# Patient Record
Sex: Male | Born: 1950 | Race: White | Hispanic: No | Marital: Married | Smoking: Former smoker
Health system: Southern US, Community
[De-identification: ages and names within clinical notes are randomized; demographics above are authoritative.]

## PROBLEM LIST (undated history)

## (undated) DIAGNOSIS — J3089 Other allergic rhinitis: Secondary | ICD-10-CM

## (undated) DIAGNOSIS — F419 Anxiety disorder, unspecified: Secondary | ICD-10-CM

## (undated) DIAGNOSIS — F17200 Nicotine dependence, unspecified, uncomplicated: Secondary | ICD-10-CM

## (undated) DIAGNOSIS — M5136 Other intervertebral disc degeneration, lumbar region: Secondary | ICD-10-CM

## (undated) DIAGNOSIS — H01006 Unspecified blepharitis left eye, unspecified eyelid: Secondary | ICD-10-CM

## (undated) DIAGNOSIS — Z8547 Personal history of malignant neoplasm of testis: Secondary | ICD-10-CM

## (undated) DIAGNOSIS — K219 Gastro-esophageal reflux disease without esophagitis: Secondary | ICD-10-CM

## (undated) DIAGNOSIS — N529 Male erectile dysfunction, unspecified: Secondary | ICD-10-CM

## (undated) DIAGNOSIS — F1020 Alcohol dependence, uncomplicated: Secondary | ICD-10-CM

## (undated) DIAGNOSIS — R609 Edema, unspecified: Secondary | ICD-10-CM

## (undated) DIAGNOSIS — IMO0001 Reserved for inherently not codable concepts without codable children: Secondary | ICD-10-CM

## (undated) DIAGNOSIS — I8390 Asymptomatic varicose veins of unspecified lower extremity: Secondary | ICD-10-CM

## (undated) DIAGNOSIS — G8929 Other chronic pain: Secondary | ICD-10-CM

## (undated) DIAGNOSIS — K7031 Alcoholic cirrhosis of liver with ascites: Principal | ICD-10-CM

## (undated) DIAGNOSIS — M542 Cervicalgia: Principal | ICD-10-CM

## (undated) DIAGNOSIS — H9190 Unspecified hearing loss, unspecified ear: Secondary | ICD-10-CM

## (undated) DIAGNOSIS — E786 Lipoprotein deficiency: Secondary | ICD-10-CM

## (undated) DIAGNOSIS — I10 Essential (primary) hypertension: Secondary | ICD-10-CM

## (undated) DIAGNOSIS — M545 Low back pain, unspecified: Secondary | ICD-10-CM

## (undated) DIAGNOSIS — K6389 Other specified diseases of intestine: Secondary | ICD-10-CM

## (undated) DIAGNOSIS — C801 Malignant (primary) neoplasm, unspecified: Secondary | ICD-10-CM

## (undated) DIAGNOSIS — R600 Localized edema: Secondary | ICD-10-CM

## (undated) DIAGNOSIS — M51369 Other intervertebral disc degeneration, lumbar region without mention of lumbar back pain or lower extremity pain: Secondary | ICD-10-CM

## (undated) DIAGNOSIS — H01003 Unspecified blepharitis right eye, unspecified eyelid: Secondary | ICD-10-CM

## (undated) HISTORY — DX: Localized edema: R60.0

## (undated) HISTORY — PX: APPENDECTOMY: SHX54

## (undated) HISTORY — DX: Unspecified hearing loss, unspecified ear: H91.90

## (undated) HISTORY — DX: Cervicalgia: M54.2

## (undated) HISTORY — DX: Low back pain, unspecified: M54.50

## (undated) HISTORY — PX: LYMPH NODE DISSECTION: SHX5087

## (undated) HISTORY — PX: OTHER SURGICAL HISTORY: SHX169

## (undated) HISTORY — DX: Other specified diseases of intestine: K63.89

## (undated) HISTORY — DX: Other intervertebral disc degeneration, lumbar region without mention of lumbar back pain or lower extremity pain: M51.369

## (undated) HISTORY — DX: Asymptomatic varicose veins of unspecified lower extremity: I83.90

## (undated) HISTORY — DX: Essential (primary) hypertension: I10

## (undated) HISTORY — DX: Male erectile dysfunction, unspecified: N52.9

## (undated) HISTORY — DX: Unspecified blepharitis left eye, unspecified eyelid: H01.006

## (undated) HISTORY — DX: Gastro-esophageal reflux disease without esophagitis: K21.9

## (undated) HISTORY — PX: TONSILLECTOMY: SUR1361

## (undated) HISTORY — DX: Nicotine dependence, unspecified, uncomplicated: F17.200

## (undated) HISTORY — DX: Other allergic rhinitis: J30.89

## (undated) HISTORY — DX: Edema, unspecified: R60.9

## (undated) HISTORY — DX: Alcohol dependence, uncomplicated: F10.20

## (undated) HISTORY — DX: Low back pain: M54.5

## (undated) HISTORY — DX: Other chronic pain: G89.29

## (undated) HISTORY — DX: Reserved for inherently not codable concepts without codable children: IMO0001

## (undated) HISTORY — DX: Alcoholic cirrhosis of liver with ascites: K70.31

## (undated) HISTORY — DX: Unspecified blepharitis right eye, unspecified eyelid: H01.003

## (undated) HISTORY — DX: Personal history of malignant neoplasm of testis: Z85.47

## (undated) HISTORY — DX: Lipoprotein deficiency: E78.6

## (undated) HISTORY — DX: Other intervertebral disc degeneration, lumbar region: M51.36

---

## 1977-07-03 DIAGNOSIS — Z8547 Personal history of malignant neoplasm of testis: Secondary | ICD-10-CM

## 1977-07-03 HISTORY — DX: Personal history of malignant neoplasm of testis: Z85.47

## 2005-06-05 ENCOUNTER — Ambulatory Visit: Payer: Self-pay | Admitting: Family Medicine

## 2005-06-06 ENCOUNTER — Encounter: Admission: RE | Admit: 2005-06-06 | Discharge: 2005-06-06 | Payer: Self-pay | Admitting: Family Medicine

## 2005-06-08 ENCOUNTER — Ambulatory Visit: Payer: Self-pay | Admitting: Family Medicine

## 2005-06-12 ENCOUNTER — Encounter: Admission: RE | Admit: 2005-06-12 | Discharge: 2005-06-12 | Payer: Self-pay | Admitting: Family Medicine

## 2005-06-14 ENCOUNTER — Ambulatory Visit: Payer: Self-pay | Admitting: Family Medicine

## 2005-07-27 ENCOUNTER — Ambulatory Visit: Payer: Self-pay | Admitting: Family Medicine

## 2006-01-10 ENCOUNTER — Inpatient Hospital Stay (HOSPITAL_COMMUNITY): Admission: EM | Admit: 2006-01-10 | Discharge: 2006-01-12 | Payer: Self-pay | Admitting: Emergency Medicine

## 2006-01-10 ENCOUNTER — Ambulatory Visit: Payer: Self-pay | Admitting: Internal Medicine

## 2006-01-15 ENCOUNTER — Ambulatory Visit: Payer: Self-pay | Admitting: Family Medicine

## 2006-02-01 ENCOUNTER — Ambulatory Visit: Payer: Self-pay | Admitting: Family Medicine

## 2006-03-19 ENCOUNTER — Ambulatory Visit: Payer: Self-pay | Admitting: Family Medicine

## 2006-04-04 ENCOUNTER — Ambulatory Visit: Payer: Self-pay | Admitting: Family Medicine

## 2006-05-28 ENCOUNTER — Ambulatory Visit: Payer: Self-pay | Admitting: Family Medicine

## 2006-05-28 LAB — CONVERTED CEMR LAB
Albumin: 4.3 g/dL (ref 3.5–5.2)
Alkaline Phosphatase: 53 units/L (ref 39–117)
Basophils Absolute: 0 10*3/uL (ref 0.0–0.1)
CO2: 30 meq/L (ref 19–32)
Chol/HDL Ratio, serum: 6.7
Creatinine, Ser: 1 mg/dL (ref 0.4–1.5)
GFR calc non Af Amer: 83 mL/min
Glomerular Filtration Rate, Af Am: 100 mL/min/{1.73_m2}
Hgb A1c MFr Bld: 5.6 % (ref 4.6–6.0)
MCHC: 33.3 g/dL (ref 30.0–36.0)
Monocytes Relative: 6.7 % (ref 3.0–11.0)
Neutro Abs: 4.5 10*3/uL (ref 1.4–7.7)
Platelets: 176 10*3/uL (ref 150–400)
RBC: 4.87 M/uL (ref 4.22–5.81)
RDW: 12.8 % (ref 11.5–14.6)
Sodium: 143 meq/L (ref 135–145)
Total Bilirubin: 1.1 mg/dL (ref 0.3–1.2)
Triglyceride fasting, serum: 55 mg/dL (ref 0–149)
VLDL: 11 mg/dL (ref 0–40)

## 2006-06-04 ENCOUNTER — Ambulatory Visit: Payer: Self-pay | Admitting: Family Medicine

## 2006-08-01 ENCOUNTER — Ambulatory Visit: Payer: Self-pay | Admitting: Family Medicine

## 2006-08-29 ENCOUNTER — Ambulatory Visit: Payer: Self-pay | Admitting: Family Medicine

## 2006-08-31 ENCOUNTER — Ambulatory Visit: Payer: Self-pay | Admitting: Internal Medicine

## 2007-02-22 IMAGING — US US SCROTUM
1 series · 13 of 25 positions shown · non-contrast
Comparison: none

CLINICAL DATA: Palpable lumps in the left testicular region on physical exam. 
 SCROTAL ULTRASOUND:
TECHNIQUE: Complete ultrasound examination of the testicles, epididymis, and other scrotal structures was performed.

[Series 1: unknown · 0.09mm/px · 13 of 48 slices shown]
[im 1/48]
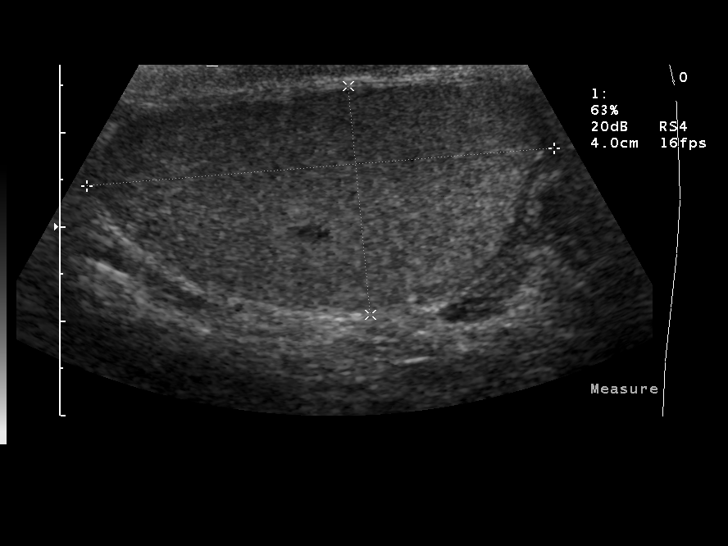
[im 4/48]
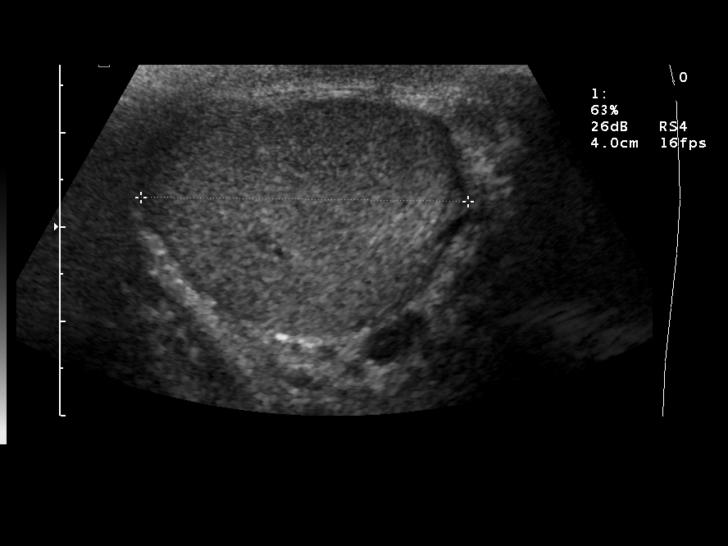
[im 8/48]
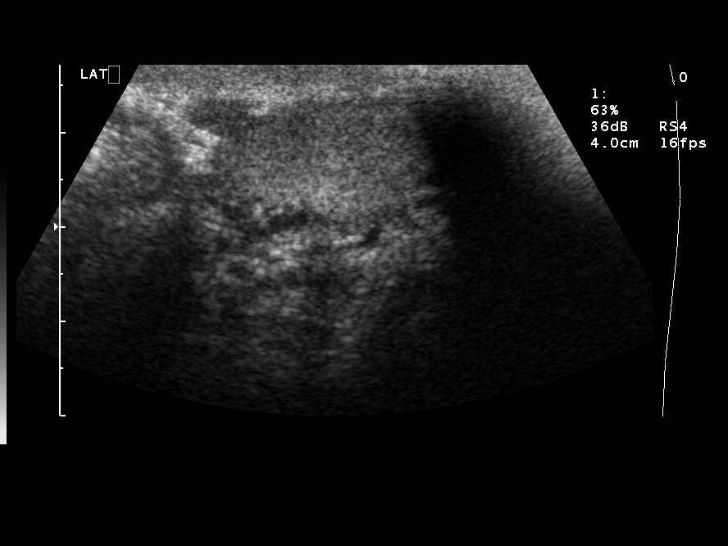
[im 12/48]
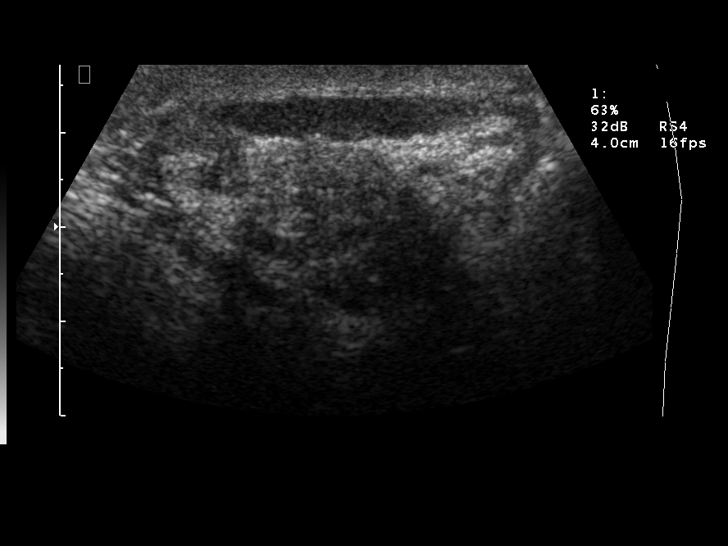
[im 16/48]
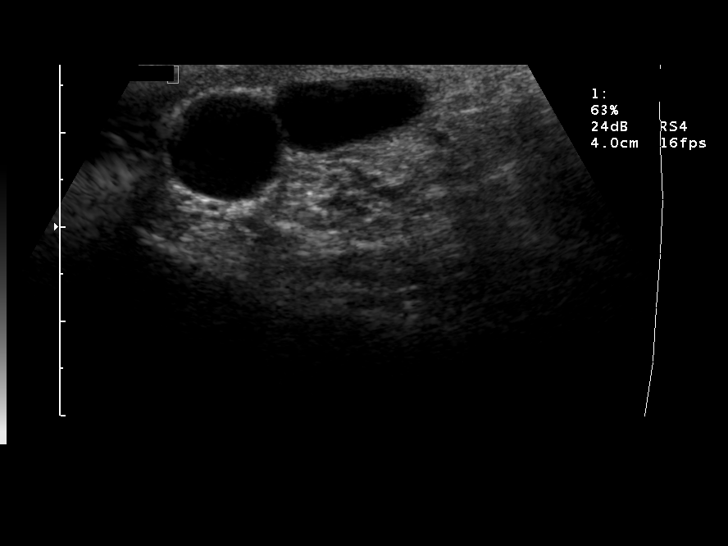
[im 20/48]
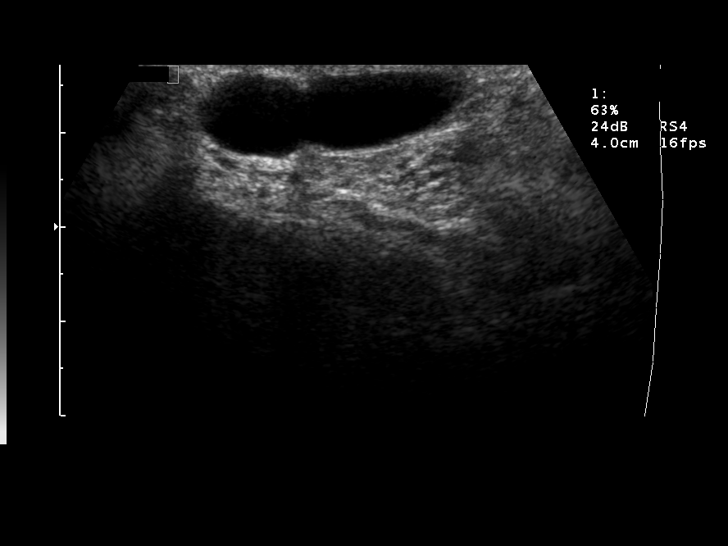
[im 24/48]
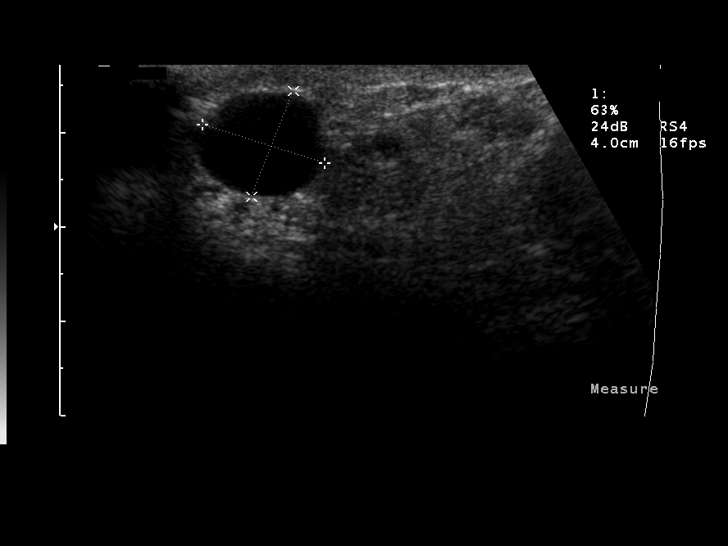
[im 28/48]
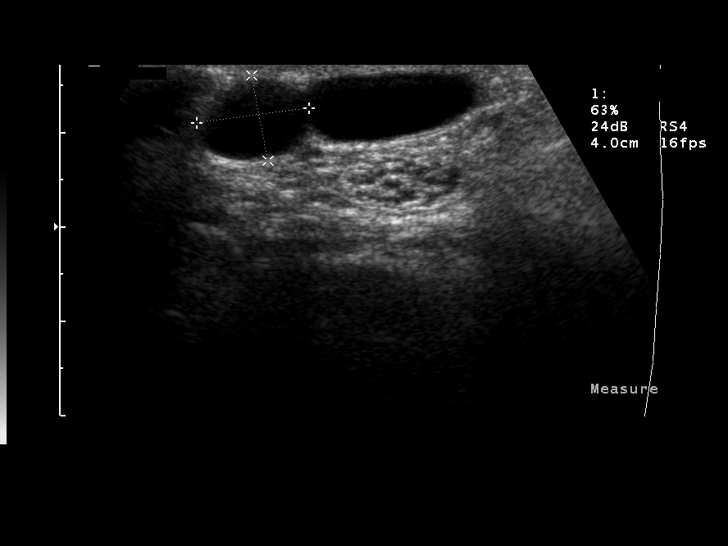
[im 32/48]
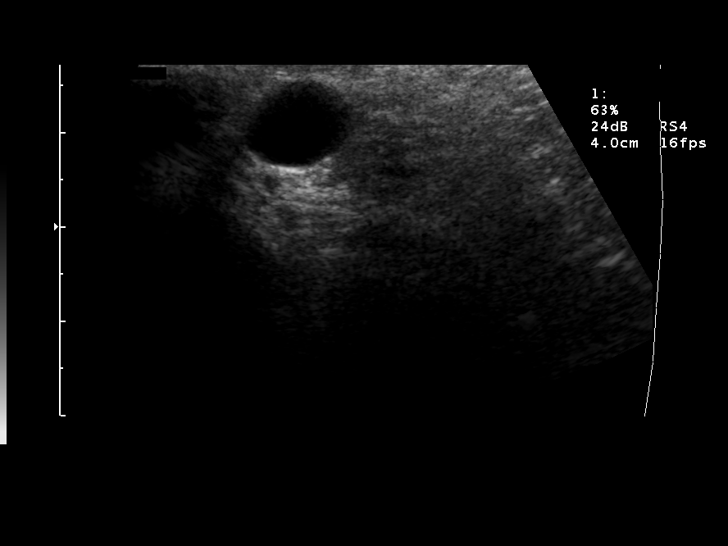
[im 36/48]
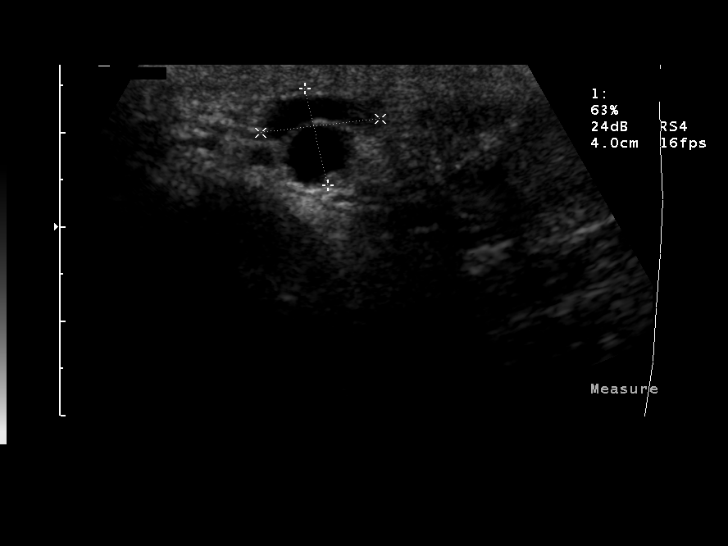
[im 40/48]
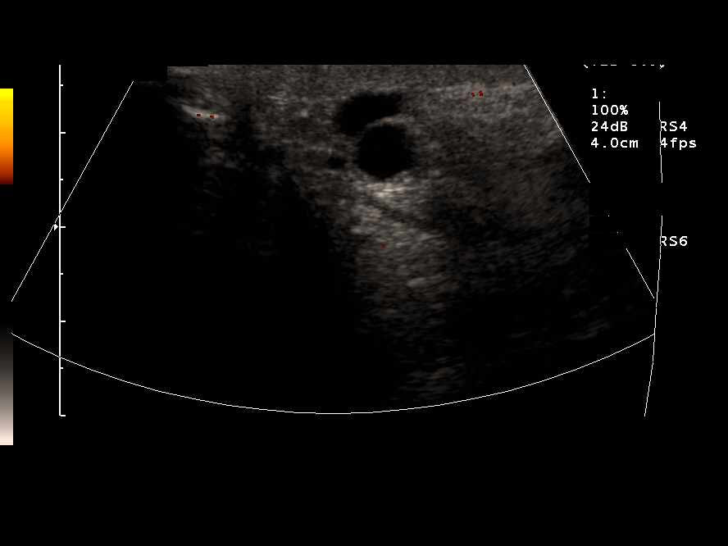
[im 44/48]
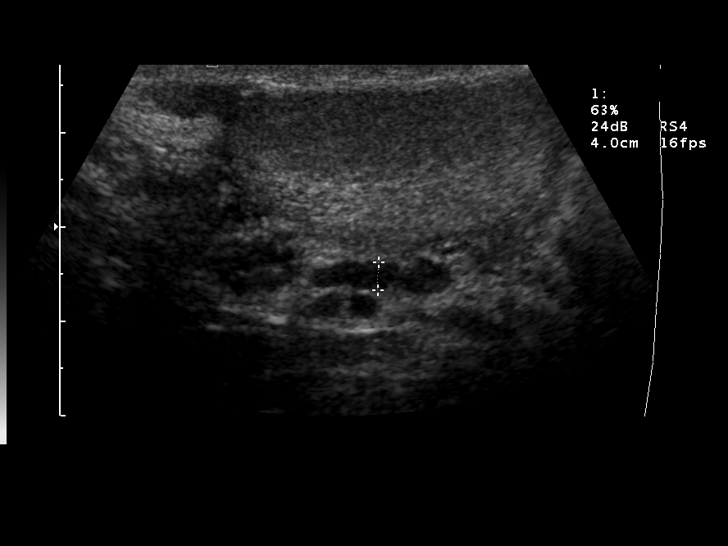
[im 48/48]
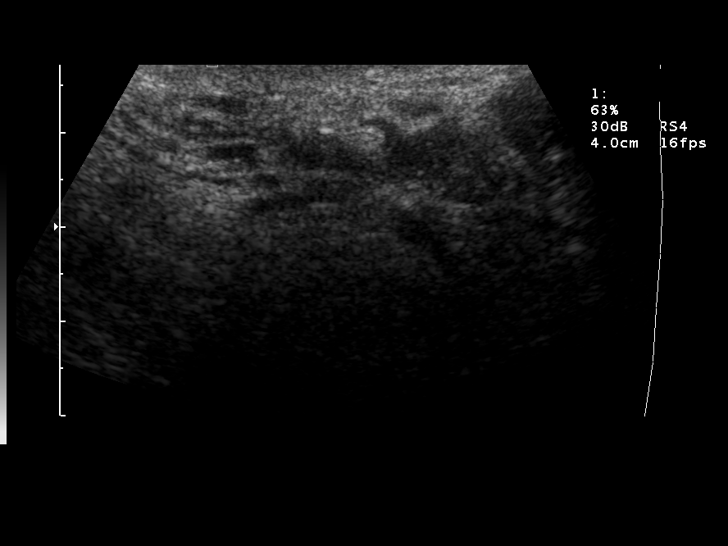

[13 of 25 positions shown; findings below may reference images not displayed]

FINDINGS: Patient has a remote history of right testicular cancer and right orchectomy.  
 Left testicle measures 5.0 x 2.4 x 3.5 cm.  There is expected color Doppler flow in the left testicle.  Arterial and Doppler wave form evaluation was neither requested nor performed.  
 There are multiple cysts extending from or immediately adjacent to the head of the left epididymis.  One of these measures 1.9 x 0.8 x 1.7 cm.  A second measures 1.2 x 0.9 x 1.2 cm; the third measures 1.1 x 1.3 x 0.7 cm; a fourth measures 1.4 x 1.2 x 1.3 cm.  No definite gut signature is identified to suggest herniated bowel, and these do not demonstrate internal vascular flow.  They are most consistent with spermatoceles or epididymal cysts.  
 There is some faint hypoechogenicity focally in the left testicle measuring 3 x 2 mm.  This is a relatively small focus and is felt to be highly unlikely to represent neoplastic disease.
IMPRESSION: 1.  Four cystic lesions are associated with the region of the epididymis on the left and likely reflect spermatoceles or epididymal cysts. 
 2.  No significant intratesticular mass is identified.

## 2007-04-29 DIAGNOSIS — I10 Essential (primary) hypertension: Secondary | ICD-10-CM | POA: Insufficient documentation

## 2007-07-19 ENCOUNTER — Ambulatory Visit: Payer: Self-pay | Admitting: Family Medicine

## 2007-07-19 LAB — CONVERTED CEMR LAB
ALT: 62 units/L — ABNORMAL HIGH (ref 0–53)
AST: 96 units/L — ABNORMAL HIGH (ref 0–37)
Basophils Relative: 1 % (ref 0.0–1.0)
Bilirubin, Direct: 0.2 mg/dL (ref 0.0–0.3)
CO2: 30 meq/L (ref 19–32)
Calcium: 9.6 mg/dL (ref 8.4–10.5)
Chloride: 103 meq/L (ref 96–112)
Eosinophils Relative: 2 % (ref 0.0–5.0)
GFR calc Af Amer: 129 mL/min
Glucose, Bld: 95 mg/dL (ref 70–99)
Hemoglobin: 15.1 g/dL (ref 13.0–17.0)
Lymphocytes Relative: 33.6 % (ref 12.0–46.0)
Neutro Abs: 2.2 10*3/uL (ref 1.4–7.7)
Platelets: 204 10*3/uL (ref 150–400)
Total Bilirubin: 1 mg/dL (ref 0.3–1.2)
Total Protein: 6.9 g/dL (ref 6.0–8.3)
Triglycerides: 78 mg/dL (ref 0–149)
VLDL: 16 mg/dL (ref 0–40)
WBC: 4.1 10*3/uL — ABNORMAL LOW (ref 4.5–10.5)

## 2007-08-09 ENCOUNTER — Ambulatory Visit: Payer: Self-pay | Admitting: Family Medicine

## 2007-08-09 DIAGNOSIS — E785 Hyperlipidemia, unspecified: Secondary | ICD-10-CM

## 2007-08-09 DIAGNOSIS — F17200 Nicotine dependence, unspecified, uncomplicated: Secondary | ICD-10-CM

## 2007-08-09 DIAGNOSIS — F1010 Alcohol abuse, uncomplicated: Secondary | ICD-10-CM | POA: Insufficient documentation

## 2007-08-28 ENCOUNTER — Ambulatory Visit: Payer: Self-pay | Admitting: Family Medicine

## 2007-12-25 ENCOUNTER — Ambulatory Visit: Payer: Self-pay | Admitting: Family Medicine

## 2007-12-25 DIAGNOSIS — I8390 Asymptomatic varicose veins of unspecified lower extremity: Secondary | ICD-10-CM

## 2007-12-25 HISTORY — DX: Asymptomatic varicose veins of unspecified lower extremity: I83.90

## 2007-12-25 LAB — CONVERTED CEMR LAB
AST: 33 units/L (ref 0–37)
Alkaline Phosphatase: 35 units/L — ABNORMAL LOW (ref 39–117)
Basophils Absolute: 0 10*3/uL (ref 0.0–0.1)
Chloride: 101 meq/L (ref 96–112)
Eosinophils Absolute: 0.1 10*3/uL (ref 0.0–0.7)
GFR calc Af Amer: 112 mL/min
GFR calc non Af Amer: 93 mL/min
MCHC: 34.8 g/dL (ref 30.0–36.0)
MCV: 104.4 fL — ABNORMAL HIGH (ref 78.0–100.0)
Neutrophils Relative %: 66.4 % (ref 43.0–77.0)
Platelets: 154 10*3/uL (ref 150–400)
Potassium: 4 meq/L (ref 3.5–5.1)
RDW: 13.6 % (ref 11.5–14.6)
Total Bilirubin: 1 mg/dL (ref 0.3–1.2)

## 2007-12-27 ENCOUNTER — Telehealth: Payer: Self-pay | Admitting: Family Medicine

## 2008-01-20 ENCOUNTER — Telehealth: Payer: Self-pay | Admitting: *Deleted

## 2008-03-03 ENCOUNTER — Telehealth: Payer: Self-pay | Admitting: Family Medicine

## 2008-04-03 ENCOUNTER — Telehealth: Payer: Self-pay | Admitting: Family Medicine

## 2008-08-10 ENCOUNTER — Ambulatory Visit: Payer: Self-pay | Admitting: Family Medicine

## 2008-11-27 ENCOUNTER — Telehealth: Payer: Self-pay | Admitting: *Deleted

## 2009-01-13 ENCOUNTER — Telehealth: Payer: Self-pay | Admitting: Family Medicine

## 2009-01-15 ENCOUNTER — Inpatient Hospital Stay (HOSPITAL_COMMUNITY): Admission: AD | Admit: 2009-01-15 | Discharge: 2009-01-22 | Payer: Self-pay | Admitting: Family Medicine

## 2009-01-15 ENCOUNTER — Ambulatory Visit: Payer: Self-pay | Admitting: Family Medicine

## 2009-01-15 ENCOUNTER — Ambulatory Visit: Payer: Self-pay | Admitting: Internal Medicine

## 2009-01-15 DIAGNOSIS — M5137 Other intervertebral disc degeneration, lumbosacral region: Secondary | ICD-10-CM

## 2009-01-20 HISTORY — PX: LUMBAR DISC SURGERY: SHX700

## 2009-01-27 ENCOUNTER — Telehealth (INDEPENDENT_AMBULATORY_CARE_PROVIDER_SITE_OTHER): Payer: Self-pay | Admitting: *Deleted

## 2009-02-04 ENCOUNTER — Ambulatory Visit: Payer: Self-pay | Admitting: Family Medicine

## 2009-10-14 ENCOUNTER — Telehealth: Payer: Self-pay | Admitting: Family Medicine

## 2010-08-02 NOTE — Progress Notes (Signed)
Summary: refill  Phone Note Call from Patient   Summary of Call: wife is calling because she would like printed rx for [facility] because of new insurance rules. Initial call taken by: Kern Reap CMA Duncan Dull),  October 14, 2009 3:31 PM  Follow-up for Phone Call        mailed to home address Follow-up by: Kern Reap CMA Duncan Dull),  October 14, 2009 3:42 PM    Prescriptions: VIAGRA 100 MG  TABS (SILDENAFIL CITRATE) usd, as needed  #6 x 11   Entered by:   Kern Reap CMA (AAMA)   Authorized by:   Roderick Pee MD   Signed by:   Kern Reap CMA (AAMA) on 10/14/2009   Method used:   Print then Give to Patient   RxID:   [id number] NIASPAN 500 MG TBCR (NIACIN (ANTIHYPERLIPIDEMIC)) Take 1 tablet by mouth once a day  #100 x 3   Entered by:   Kern Reap CMA (AAMA)   Authorized by:   Roderick Pee MD   Signed by:   Kern Reap CMA (AAMA) on 10/14/2009   Method used:   Print then Give to Patient   RxID:   [id number] LISINOPRIL-HYDROCHLOROTHIAZIDE 20-25 MG TABS (LISINOPRIL-HYDROCHLOROTHIAZIDE) Take 1 tablet by mouth once a day  #100 x 3   Entered by:   Kern Reap CMA (AAMA)   Authorized by:   Roderick Pee MD   Signed by:   Kern Reap CMA (AAMA) on 10/14/2009   Method used:   Print then Give to Patient   RxID:   [id number]

## 2010-09-30 ENCOUNTER — Other Ambulatory Visit: Payer: Self-pay | Admitting: Family Medicine

## 2010-10-02 ENCOUNTER — Other Ambulatory Visit: Payer: Self-pay | Admitting: Family Medicine

## 2010-10-03 IMAGING — CR DG CHEST 2V
2 series · 2 of 2 positions shown · non-contrast
Comparison: None available.

CLINICAL DATA: Preoperative respiratory film

CHEST - 2 VIEW

[w chest pa]
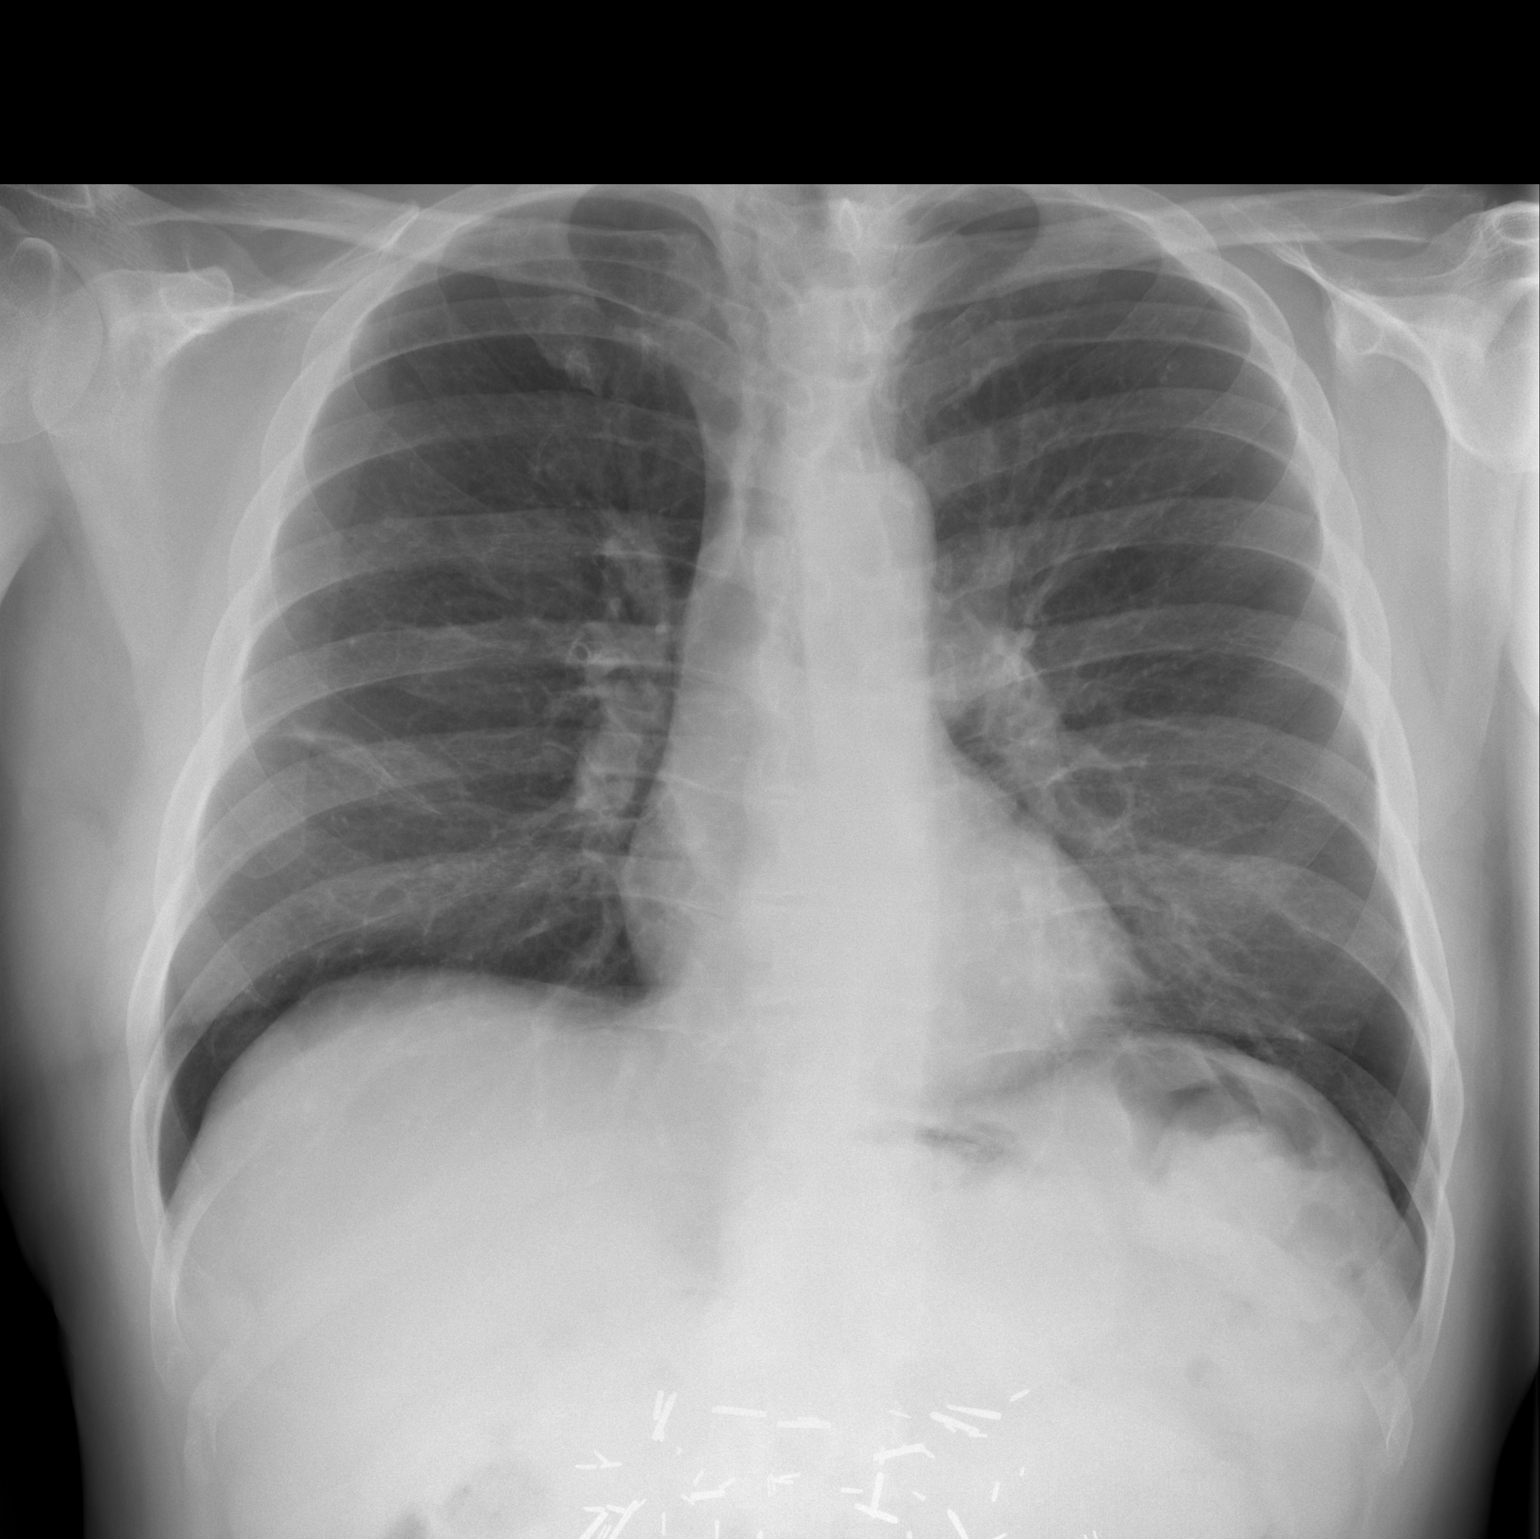

[w chest lat]
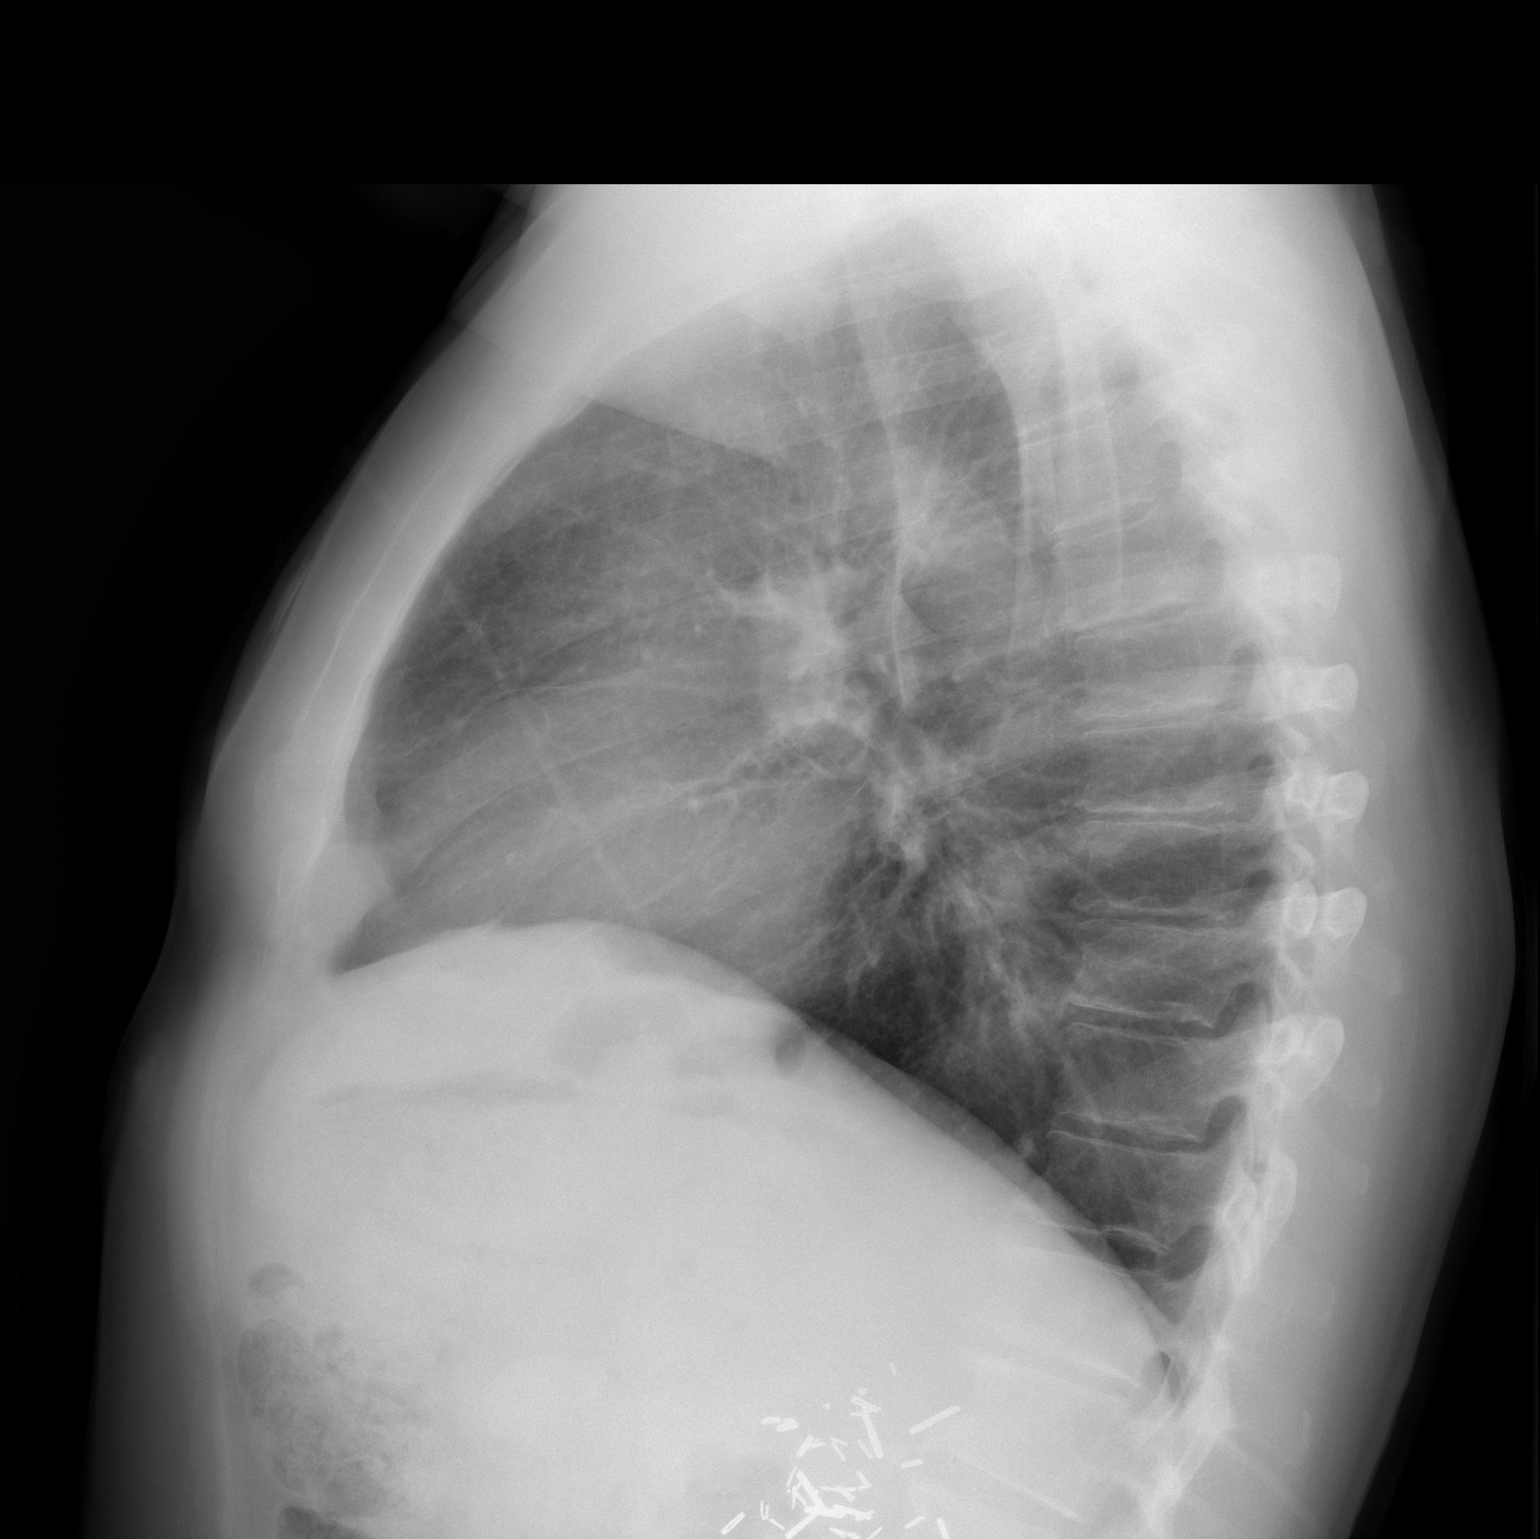

[2 of 2 positions shown; findings below may reference images not displayed]

FINDINGS: There is some discoid atelectasis in the right mid lung.
Lungs otherwise clear.  No effusion.  Heart size normal.  No focal
bony abnormality.  Multiple surgical clips the upper abdomen noted.
IMPRESSION: Mild right side atelectasis.  Otherwise, negative.

## 2010-10-09 LAB — TYPE AND SCREEN: Antibody Screen: NEGATIVE

## 2010-10-09 LAB — APTT: aPTT: 24 seconds (ref 24–37)

## 2010-10-09 LAB — COMPREHENSIVE METABOLIC PANEL
ALT: 25 U/L (ref 0–53)
ALT: 40 U/L (ref 0–53)
Albumin: 3 g/dL — ABNORMAL LOW (ref 3.5–5.2)
Alkaline Phosphatase: 42 U/L (ref 39–117)
BUN: 11 mg/dL (ref 6–23)
CO2: 30 mEq/L (ref 19–32)
Calcium: 9.1 mg/dL (ref 8.4–10.5)
Chloride: 104 mEq/L (ref 96–112)
Chloride: 99 mEq/L (ref 96–112)
GFR calc non Af Amer: >60 mL/min (ref >60)
Glucose, Bld: 105 mg/dL — ABNORMAL HIGH (ref 70–99)
Glucose, Bld: 114 mg/dL — ABNORMAL HIGH (ref 70–99)
Potassium: 4.5 mEq/L (ref 3.5–5.1)
Sodium: 136 mEq/L (ref 135–145)
Sodium: 138 mEq/L (ref 135–145)
Total Bilirubin: 0.5 mg/dL (ref 0.3–1.2)
Total Bilirubin: 1.9 mg/dL — ABNORMAL HIGH (ref 0.3–1.2)
Total Protein: 5.5 g/dL — ABNORMAL LOW (ref 6.0–8.3)

## 2010-10-09 LAB — URINALYSIS, ROUTINE W REFLEX MICROSCOPIC
Bilirubin Urine: NEGATIVE
Glucose, UA: NEGATIVE mg/dL
Hgb urine dipstick: NEGATIVE
Ketones, ur: NEGATIVE mg/dL
Specific Gravity, Urine: 1.008 (ref 1.005–1.030)
pH: 7 (ref 5.0–8.0)

## 2010-10-09 LAB — CBC
HCT: 34.8 % — ABNORMAL LOW (ref 39.0–52.0)
HCT: 35.5 % — ABNORMAL LOW (ref 39.0–52.0)
Hemoglobin: 11.7 g/dL — ABNORMAL LOW (ref 13.0–17.0)
Hemoglobin: 12.8 g/dL — ABNORMAL LOW (ref 13.0–17.0)
MCHC: 32.3 g/dL (ref 30.0–36.0)
MCHC: 33.9 g/dL (ref 30.0–36.0)
MCHC: 33.9 g/dL (ref 30.0–36.0)
MCV: 96.2 fL (ref 78.0–100.0)
MCV: 96.6 fL (ref 78.0–100.0)
MCV: 97.3 fL (ref 78.0–100.0)
Platelets: 218 10*3/uL (ref 150–400)
RBC: 3.53 MIL/uL — ABNORMAL LOW (ref 4.22–5.81)
RBC: 3.65 MIL/uL — ABNORMAL LOW (ref 4.22–5.81)
RBC: 3.93 MIL/uL — ABNORMAL LOW (ref 4.22–5.81)
RDW: 17.1 % — ABNORMAL HIGH (ref 11.5–15.5)
RDW: 17.9 % — ABNORMAL HIGH (ref 11.5–15.5)
WBC: 4.6 10*3/uL (ref 4.0–10.5)
WBC: 6.6 10*3/uL (ref 4.0–10.5)

## 2010-10-09 LAB — BASIC METABOLIC PANEL
BUN: 5 mg/dL — ABNORMAL LOW (ref 6–23)
BUN: 6 mg/dL (ref 6–23)
CO2: 28 mEq/L (ref 19–32)
CO2: 28 mEq/L (ref 19–32)
Calcium: 8.8 mg/dL (ref 8.4–10.5)
Chloride: 103 mEq/L (ref 96–112)
Creatinine, Ser: 0.8 mg/dL (ref 0.4–1.5)
Creatinine, Ser: 0.81 mg/dL (ref 0.4–1.5)
GFR calc Af Amer: >60 mL/min (ref >60)
GFR calc Af Amer: >60 mL/min (ref >60)
Potassium: 4.1 mEq/L (ref 3.5–5.1)

## 2010-10-09 LAB — ABO/RH: ABO/RH(D): A NEG

## 2010-10-09 LAB — PROTIME-INR
INR: 1 (ref 0.00–1.49)
Prothrombin Time: 13 seconds (ref 11.6–15.2)

## 2010-10-09 LAB — ETHANOL: Alcohol, Ethyl (B): <5 mg/dL (ref 0–10)

## 2010-10-09 LAB — LIPASE, BLOOD: Lipase: 36 U/L (ref 11–59)

## 2010-10-10 ENCOUNTER — Other Ambulatory Visit: Payer: Self-pay | Admitting: Family Medicine

## 2010-11-15 NOTE — Op Note (Signed)
Jared Garrett, Jared Garrett               ACCOUNT NO.:  [id number]   MEDICAL RECORD NO.:  [id number]          PATIENT TYPE:  INP   LOCATION:  1430                         FACILITY:  [facility]   PHYSICIAN:  Georges Lynch. Gioffre, M.D.DATE OF BIRTH:  1950/10/11   DATE OF PROCEDURE:  DATE OF DISCHARGE:                               OPERATIVE REPORT   PREOPERATIVE DIAGNOSES:  1. 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  2. Herniated lumbar disk L5-S1 on the left into the foramina of L5-S1      region with a foot drop on the left.   POSTOPERATIVE DIAGNOSES:  1. 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  2. Herniated lumbar disk L5-S1 on the left into the foramina of L5-S1      region with a foot drop on the left.   OPERATION:  1. Hemilaminectomy and decompression of the lateral recess at L5-S1 on      the left for recess stenosis.  2. Microdiskectomy L5-S1 on the left for a foraminal disk herniation.   PROCEDURE:  Under general anesthesia with the patient on a spinal frame,  a routine orthopedic prep of the back was carried out followed by a  sterile prep with DuraPrep.  Note, the patient had 1 gram of IV Ancef.  After the prep and draping was carried out, I inserted two needles for  localization purposes.  An x-ray was taken after we identified the L5-S1  space.  A second x-ray then was taken in the space itself.  I made an  incision over the lower lumbar region.  Bleeders identified and  cauterized.  I then went down and stripped the muscle from the lamina  bilaterally and inserted the [facility] retractors.  Another second x-  ray was taken then.  At this time, I identified the lamina of L5 and  identified the L5-S1 space on the left.  I did a wide laminectomy and  went out far laterally and decompressed the recess.  I cauterized the  lateral recess veins and a microscope was used at this time.  We  identified the S1 root, gently retracted the dura, identified a large  herniated disk.  We  made a cruciate incision in the posterior  longitudinal ligament and we went out far laterally and took a large  amount of disk material that extruded out into the foramen.  We made  multiple passes in the disk space.  The disk was severely degenerated  and that was removed.  We did a nice foraminotomy as well.  When we  completed the procedure, we were able to go up with the hockey stick out  in the foramina of the L5 root, as well as the S1 root, both now were  totally free and there was no further compression.  I thoroughly  irrigated out the area.  I loosely applied some thrombin-soaked Gelfoam  into the operative site.  The wound was closed in layers in the usual  fashion, but I did leave a portion of the deep proximal and distal part  of the wounds open for drainage purposes.  The subcu was closed with 0  Vicryl.  The skin was closed metal staples.  A sterile Neosporin  dressing was applied.  The patient left the operative room in  satisfactory condition.   SURGEON:  Dr. Darrelyn Hillock.   ASSISTANT:  Dr. Jene Every.           ______________________________  Georges Lynch. Darrelyn Hillock, M.D.     RAG/MEDQ  D:  01/20/2009  T:  01/21/2009  Job:  [id number]   cc:   Tinnie Gens A. Tawanna Cooler, MD  [address] [place]  [place] [address]

## 2010-11-18 NOTE — Discharge Summary (Signed)
Jared Garrett, Jared Garrett               ACCOUNT NO.:  [id number]   MEDICAL RECORD NO.:  [id number]          PATIENT TYPE:  INP   LOCATION:  1430                         FACILITY:  [facility]   PHYSICIAN:  Georges Lynch. Gioffre, M.D.DATE OF BIRTH:  [birth year]   DATE OF ADMISSION:  01/15/2009  DATE OF DISCHARGE:  01/22/2009                               DISCHARGE SUMMARY   ADMITTING DIAGNOSES:  1. Alcohol abuse.  2. Tobacco abuse.  3. Hypertension.  4. Hyperlipidemia.  5. 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   DISCHARGE DIAGNOSES:  1. Status post decompression of the lateral recess at L5-S1 and      microdiskectomy of L5-S1 on the left for foraminal disk herniation.  2. Alcohol abuse.  3. Tobacco abuse.  4. Hypertension.  5. Hyperlipidemia.   PROCEDURE:  On January 20, 2009, patient underwent hemilaminectomy and  decompression of the lateral recess at L5-S1 on the left for recess  stenosis and microdiskectomy at L5-S1 on the left for foraminal disk  herniation.   SURGEON:  Dr. Ranee Gosselin   ASSISTANT SURGEON:  Dr. Jene Every   BRIEF HISTORY:  Mr. Custard has been followed by Dr. Darrelyn Hillock for worsening  low back pain.  Patient on arrival was found to have lateral recess  stenosis at L5-S1 on the left as well as a herniated lumbar disk at L5-  S1 on the left into the foramina of L5-S1 region with foot drop on the  left.  However, patient has a history of alcohol abuse, and it was  determined by Mr. Packard primary care physician, Dr. Kelle Darting at  [facility], that patient should be brought into the hospital ahead of time  for alcohol withdrawal treatment prior to surgery.  So, patient's date  of admission was January 15, 2009.  Procedure did not take place until January 20, 2009.   LABORATORY DATA:  On January 15, 2009, when patient was admitted by  medicine for alcohol withdrawal, patient's CBC revealed hemoglobin of  12.8, hematocrit 37.8, white blood cell count 6.6 and a  platelet count  of 137.  Patient's chemistry panel revealed sodium of 136, potassium  3.6, chloride 99, bicarb 30, glucose 114, BUN 10, creatinine 0.73.  Patient's urinalysis at that time was negative.  Patient's true  preoperative CBC which was drawn on January 19, 2009, revealed hemoglobin  of 11.7, hematocrit 34.8, white blood cell count of 4.6 and a platelet  count of 202.  At that time, chemistry panel was unremarkable except for  a glucose of 105.  Following surgery, the patient had another CBC drawn  on January 21, 2009, which would be postop day 1.  Hemoglobin was 11.5,  hematocrit 35.5, white blood cell count 6.5, and platelet count was 220.  Chemistry drawn on that day was unremarkable, and CBC that was drawn on  patient's postop day 2, also patient's discharge day, showed hemoglobin  of 11.6, hematocrit of 34.1.  Patient did not require transfusion during  his hospital stay.  White blood cell count was 8.2 and platelet count of  218.  Chemistry was  unremarkable.   EKG:  None.   HOSPITAL COURSE:  Patient was admitted to [facility] on January 15, 2009,  for alcohol withdrawal by his primary care physician, Dr. Kelle Darting.  During his admission prior to surgery, patient was closely watched by  medicine.  Patient was placed on a phenobarb protocol for alcohol  withdrawal per pharmacy.  Patient was also given nicotine patch daily  for tobacco abuse.  Patient did well with the phenobarb protocol, did  not have any withdrawal symptoms, no tremors, anything along those  lines.  Patient tolerated his procedure well on January 20, 2009.  He went  to the recovery room and then back up to the floor.  Patient was started  on a PCA pain pump and continued on oral analgesics for pain control  following surgery.  He did have general anesthesia and did not have any  difficulties with nausea or waking up from this.  Patient did not have  any difficulty urinating on his own or passing bowel movements  once the  Foley was discontinued.  At postop day 1 which would be January 21, 2009,  patient was doing well.  His foot drop was gone.  Patient did not  complain of any leg pain at that time.  Patient's PCA was continued  through postop day 1, and patient was discharged home on postop day 2  which would be January 22, 2009.  Patient did not require transfusion  through his hospital stay.   DISCHARGE PLAN:  Patient was discharged to home.  [facility]  has been arranged for him.   DISCHARGE DIAGNOSES:  Please see above.   DISCHARGE MEDICATIONS:  1. Valium 10 mg q.i.d.  2. Lisinopril/hydrochlorothiazide 20/25 mg daily.  3. Niaspan 500 mg daily.  The patient is to discontinue this drug.  4. Viagra 100 mg as needed.  5. Ativan 0.5 mg t.i.d.  6. Antabuse 250 mg daily.  7. Hydromet 1.5 mg-5 mg/5 mL t.i.d. as needed.  8. Dilaudid 2 mg q.4 h. as needed.  9. Percocet 10/650 one tab p.o. q.4-6 h. p.r.n. pain.  10.Robaxin 500 mg 1 tab p.o. t.i.d. p.r.n. muscle spasm.   DIET:  Regular.   ACTIVITY:  Patient is weightbearing as tolerated.  Patient needs to  continue his gait training, ambulation, ADLs, PT and OT per [facility].  Patient may start showering.  He needs to take the dressing  off, cover it with Saran wrap, and then after the shower put a fresh  dressing on.  Daily dressing changes to continue.  Patient should not  submerge the incision in water.   FOLLOWUP:  The patient is to follow up in 2 weeks from the date of  surgery.  Please contact the office at [phone] to arrange follow-up  appointment.   DISPOSITION:  Home.   CONDITION ON DISCHARGE:  Improving.      Rozell Searing, PAC    ______________________________  Georges Lynch Darrelyn Hillock, M.D.    LD/MEDQ  D:  02/08/2009  T:  02/08/2009  Job:  [id number]

## 2010-11-18 NOTE — Discharge Summary (Signed)
NAME:  Jared Garrett, Jared Garrett               ACCOUNT NO.:  [id number]   MEDICAL RECORD NO.:  [id number]          PATIENT TYPE:  INP   LOCATION:  3701                         FACILITY:  [facility]   PHYSICIAN:  Sean A. Everardo All, M.D. [facility] OF BIRTH:  [birth year]   DATE OF ADMISSION:  01/10/2006  DATE OF DISCHARGE:                                 DISCHARGE SUMMARY   DIAGNOSES AT THE TIME OF DISCHARGE:  1.  Alcohol withdrawal syndrome.  2.  Hypertension.  3.  Mild alcoholic hepatitis.  4.  Mild anemia.   HISTORY OF THE PRESENT ILLNESS:  A 60 year old man admitted by Dr. Felicity Coyer  on January 10, 2006, with acute alcohol withdrawal syndrome.  Please refer to  Dr. Diamantina Monks history and physical for details.   HOSPITAL COURSE:  #1 - The patient was admitted and treated on the protocol  for alcohol withdrawal syndrome.  On this, he steadily improved to the point  where on January 12, 2006, he was alert, oriented, ambulatory, and eating his  usual diet, and was determined to be medically clear.   #2 - HYPERTENSION.  His blood pressure was well controlled during his  hospitalization on his outpatient medication schedule.   #3 - LABORATORY ABNORMALITIES.  He was noted to have anemia and elevated  hepatic transaminases and these will need to be followed up as an outpatient  and evaluated if appropriate.   #4 - CHRONIC ALCOHOLISM.  He was seen in consultation by psychiatry and as  of the time of dictation a decision has not yet been made if he would be  transferred to the [facility] versus discharged home.  If he  is discharged home, he will continue his phenobarbital taper and return to  work January 22, 2006.   MEDICATIONS:  1.  Lisinopril/hydrochlorothiazide 20/25 one daily.  2.  Celexa 20 mg daily.  3.  Lopressor 25 mg twice daily.  4.  Nicotine patch 14 mg daily.  5.  Phenobarbital tapering schedule.  On January 12, 2006, he takes 30 mg twice      daily.  On January 13, 2006, 15 mg three times  daily.  On January 14, 2006, he      takes 15 mg twice daily, and then this medication is discontinued.   DIET:  No restriction.  Complete abstinence from alcohol is advised.   ACTIVITY:  1.  All individuals are advised to never operate a motor vehicle while      intoxicated.  2.  Return to work January 22, 2006.   FOLLOWUP:  Dr. Tawanna Cooler in 2 weeks.  He will need a recheck of his anemia and  LFT.  Any abnormalities should be evaluated if appropriate.           ______________________________  Cleophas Dunker. Everardo All, M.D. [facility]     SAE/MEDQ  D:  01/12/2006  T:  01/12/2006  Job:  [id number]   cc:   Tinnie Gens A. Tawanna Cooler, M.D. [facility]  [address] [place]  [place] [address]

## 2010-12-05 ENCOUNTER — Other Ambulatory Visit: Payer: Self-pay | Admitting: Family Medicine

## 2011-01-19 ENCOUNTER — Other Ambulatory Visit: Payer: Self-pay

## 2011-01-26 ENCOUNTER — Encounter: Payer: Self-pay | Admitting: Family Medicine

## 2011-02-06 ENCOUNTER — Other Ambulatory Visit: Payer: Self-pay

## 2011-02-23 ENCOUNTER — Telehealth: Payer: Self-pay | Admitting: Family Medicine

## 2011-02-23 NOTE — Telephone Encounter (Signed)
Wonderful. 

## 2011-02-23 NOTE — Telephone Encounter (Signed)
Pt. Would like to transfer to [facility] to see Dr. Earley Favor. The [place] location is closer to where he lives and his wife recently transferred there as well. Please advise

## 2011-02-27 ENCOUNTER — Other Ambulatory Visit: Payer: Self-pay

## 2011-02-28 ENCOUNTER — Encounter: Payer: Self-pay | Admitting: Family Medicine

## 2011-02-28 NOTE — Telephone Encounter (Signed)
OK. --PM

## 2011-02-28 NOTE — Telephone Encounter (Signed)
SW patient, he will CB next week to schedule a CPX with fasting labs

## 2011-03-13 ENCOUNTER — Encounter: Payer: Self-pay | Admitting: Family Medicine

## 2011-07-04 HISTORY — PX: COLONOSCOPY: SHX174

## 2012-07-03 DIAGNOSIS — K7031 Alcoholic cirrhosis of liver with ascites: Secondary | ICD-10-CM

## 2012-07-03 HISTORY — DX: Alcoholic cirrhosis of liver with ascites: K70.31

## 2012-07-24 ENCOUNTER — Ambulatory Visit (HOSPITAL_COMMUNITY)
Admission: RE | Admit: 2012-07-24 | Discharge: 2012-07-24 | Disposition: A | Discharge status: Home / Self Care | Payer: BC Managed Care – PPO | Source: Ambulatory Visit | Attending: Orthopedic Surgery | Admitting: Orthopedic Surgery

## 2012-07-24 DIAGNOSIS — M7989 Other specified soft tissue disorders: Secondary | ICD-10-CM

## 2012-07-24 NOTE — Progress Notes (Signed)
*  PRELIMINARY RESULTS* Vascular Ultrasound Bilateral lower extremity venous ultrasound  has been completed.  Preliminary findings: Bilateral: No evidence of DVT, superficial thrombosis or Sassano's cyst. Interstitial fluid noted.  Cathie Beams 07/24/2012, 12:01 PM

## 2012-07-26 ENCOUNTER — Encounter: Payer: Self-pay | Admitting: Family Medicine

## 2012-07-26 ENCOUNTER — Ambulatory Visit (INDEPENDENT_AMBULATORY_CARE_PROVIDER_SITE_OTHER): Payer: BC Managed Care – PPO | Admitting: Family Medicine

## 2012-07-26 ENCOUNTER — Other Ambulatory Visit: Payer: Self-pay | Admitting: Family Medicine

## 2012-07-26 VITALS — BP 127/83 | HR 96 | Temp 98.0°F | Wt 238.0 lb

## 2012-07-26 DIAGNOSIS — F1020 Alcohol dependence, uncomplicated: Secondary | ICD-10-CM

## 2012-07-26 DIAGNOSIS — R601 Generalized edema: Secondary | ICD-10-CM

## 2012-07-26 DIAGNOSIS — R609 Edema, unspecified: Secondary | ICD-10-CM

## 2012-07-26 LAB — CBC
MCH: 35.5 pg — ABNORMAL HIGH (ref 26.0–34.0)
MCHC: 34.8 g/dL (ref 30.0–36.0)
MCV: 101.9 fL — ABNORMAL HIGH (ref 78.0–100.0)
Platelets: 188 10*3/uL (ref 150–400)
RDW: 14.1 % (ref 11.5–15.5)

## 2012-07-26 LAB — COMPREHENSIVE METABOLIC PANEL
ALT: 34 U/L (ref 0–53)
Alkaline Phosphatase: 156 U/L — ABNORMAL HIGH (ref 39–117)
CO2: 30 mEq/L (ref 19–32)
Creat: 0.67 mg/dL (ref 0.50–1.35)
Sodium: 132 mEq/L — ABNORMAL LOW (ref 135–145)
Total Bilirubin: 3.8 mg/dL — ABNORMAL HIGH (ref 0.3–1.2)
Total Protein: 5.2 g/dL — ABNORMAL LOW (ref 6.0–8.3)

## 2012-07-26 MED ORDER — FUROSEMIDE 40 MG PO TABS: ORAL_TABLET | ORAL | Status: DC

## 2012-07-26 NOTE — Assessment & Plan Note (Signed)
He has some classic findings of portal hypertension, presumably from cirrhotic/congested liver. Will check UA, CMET, CBC, Hep B surface antigen, anti Hep C antibody, and will order an abdominal ultrasound for early next week. Will start lasix 40mg  once daily, may adjust based on electrolyte results.   I instructed him to just hold his lisin/hctz and his cholesterol med (niaspan?-pt not sure) for now. F/u in office in 1 wk.

## 2012-07-26 NOTE — Progress Notes (Signed)
OFFICE NOTE  07/26/2012  CC:  Chief Complaint  Patient presents with  . Establish Care    edema-feet, legs and abdomen      HPI: Patient is a 62 y.o. Caucasian male who is here to establish care with me, transfer from Dr. Tawanna Garrett at [facility].  His main issue to discuss is new onset edema.   Pt very difficult historian, tells stories in circles/tangentially when asked a simple question like "when did your swelling start".  Required frequent redirection.  Additionally, he is hearing impaired.  Seems like he's had bilat LE swelling for the last 4 wks or so.   He has noted some swelling in abd and suprapubic and LB regions but not in hands.  No SOB, but feels a bit more strain in carrying around extra wt associated with peripheral fluid/edema.  No pain in legs.  Urine output no different over the last month.  No chest pain.  No new meds prior to onset.   He noted no past hx of lower extremity swelling prior to a month ago per his report today. No significant change in diet or sodium intake: says he tries to watch sodium intake.  Drinking lots of water (says 10-12 or more bottles per day).  He has never been on lasix.  He has lisin/hctz to take for HTN but says he takes it an avg of one time per week.  He has hx of alcohol abuse; says he quit alcohol 11/2011 when he retired.  He relapsed in Sept/Oct 2013, quit again 05/13/12.  Vodka was his drink of choice when abusing.  He has occasional beer now, no further than 1 beer at a time.  Pertinent PMH:  Past Medical History  Diagnosis Date  . Alcoholism     Records show he was on this as early as 12/2007.  Marland Kitchen Hypertension   . Hyperlipidemia   . Degenerative disc disease, lumbar   . History of testicular cancer   . Erectile dysfunction   . Tobacco dependence   . Peripheral edema     legs.  07/2012 bilateral venous/art dopplers via ortho were neg for DVT or Slevin's cyst but interstitial fluid noted by ultrasonographer  . Chronic low back  pain   . 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    MEDS: **Not taking any of these currently Outpatient Prescriptions Prior to Visit  Medication Sig Dispense Refill  . lisinopril-hydrochlorothiazide (PRINZIDE,ZESTORETIC) 20-25 MG per tablet TAKE 1 TABLET BY MOUTH EVERY DAY  100 tablet  0  . NIASPAN 500 MG CR tablet TAKE 1 TABLET BY MOUTH EVERY DAY  100 tablet  1  . VIAGRA 100 MG tablet TAKE AS DIRECTED  6 tablet  1  Last reviewed on 07/26/2012  3:08 PM by Jared Massed, MD  PE: Blood pressure 127/83, pulse 96, temperature 98 F (36.7 C), temperature source Temporal, weight 238 lb (107.956 kg), SpO2 97.00%. Gen: Alert, obese white male in NAD.  Patient is oriented to person, place, time, and situation. ENT:   Eyes: no injection, icteris, swelling, or exudate.  EOMI, PERRLA. Nose: no drainage or turbinate edema/swelling.  No injection or focal lesion.  Mouth: lips without lesion/swelling.  Oral mucosa pink and moist.  Oropharynx without erythema, exudate, or swelling.  Neck - No masses or thyromegaly or limitation in range of motion.  Redundant skin/soft tissue anteriorly, but no mass.  CV: RRR, no m/r/g.   LUNGS: CTA bilat, nonlabored resps, good aeration in all lung fields. ABD: soft, rotund/distended, + flank dullness.  Nontender.  No HSM or mass.  BS normal.   He has pitting edema of his skin from mid trunk level down to feet, with more profound edema below the knees: 3++ pitting bilaterally, pinkish hue to anterior tibial surfaces, without warmth or tenderness or skin breakdown.   No clubbing or cyanosis.  LABS: none today (he was unable to give a urine specimen today and will bring one in early next week)  IMPRESSION AND PLAN:  Transfer pt: reviewed old EMR records.  Anasarca He has some classic findings of portal hypertension, presumably from cirrhotic/congested liver. Will check UA, CMET, CBC,  Hep B surface antigen, anti Hep C antibody, and will order an abdominal ultrasound for early next week. Will start lasix 40mg  once daily, may adjust based on electrolyte results.   I instructed him to just hold his lisin/hctz and his cholesterol med (niaspan?-pt not sure) for now. F/u in office in 1 wk.  Alcoholism Encouraged ongoing complete abstinence. He seems to be doing ok with this right now.   An After Visit Summary was printed and given to the patient.  FOLLOW UP: 1 wk

## 2012-07-26 NOTE — Assessment & Plan Note (Signed)
Encouraged ongoing complete abstinence. He seems to be doing ok with this right now.

## 2012-07-27 ENCOUNTER — Telehealth: Payer: Self-pay | Admitting: Family Medicine

## 2012-07-27 LAB — PROTIME-INR

## 2012-07-27 LAB — HEPATITIS B SURFACE ANTIGEN: Hepatitis B Surface Ag: NEGATIVE

## 2012-07-27 MED ORDER — POTASSIUM CHLORIDE CRYS ER 20 MEQ PO TBCR: EXTENDED_RELEASE_TABLET | ORAL | Status: DC

## 2012-07-27 NOTE — Telephone Encounter (Signed)
Notified patient of low potassium.  I started him on lasix the same day as this lab was drawn so I expect potassium to drop further. Therefore I sent eRx for KDUR 20 mEq, 2 tabs qd--to [facility] in [place] and told pt to start this med today.  I did not notify patient of any other lab results today.

## 2012-07-29 LAB — POCT URINALYSIS DIPSTICK
Ketones, UA: NEGATIVE
Leukocytes, UA: NEGATIVE
Nitrite, UA: NEGATIVE
Protein, UA: NEGATIVE
Urobilinogen, UA: 4
pH, UA: 5.5

## 2012-07-31 ENCOUNTER — Ambulatory Visit (HOSPITAL_BASED_OUTPATIENT_CLINIC_OR_DEPARTMENT_OTHER): Payer: BC Managed Care – PPO

## 2012-08-01 ENCOUNTER — Ambulatory Visit (HOSPITAL_COMMUNITY)
Admission: RE | Admit: 2012-08-01 | Discharge: 2012-08-01 | Disposition: A | Discharge status: Home / Self Care | Payer: BC Managed Care – PPO | Source: Ambulatory Visit | Attending: Family Medicine | Admitting: Family Medicine

## 2012-08-01 ENCOUNTER — Other Ambulatory Visit: Payer: Self-pay | Admitting: Family Medicine

## 2012-08-01 ENCOUNTER — Encounter: Payer: Self-pay | Admitting: Internal Medicine

## 2012-08-01 DIAGNOSIS — J90 Pleural effusion, not elsewhere classified: Secondary | ICD-10-CM | POA: Insufficient documentation

## 2012-08-01 DIAGNOSIS — K838 Other specified diseases of biliary tract: Secondary | ICD-10-CM | POA: Insufficient documentation

## 2012-08-01 DIAGNOSIS — R188 Other ascites: Secondary | ICD-10-CM | POA: Insufficient documentation

## 2012-08-01 DIAGNOSIS — R601 Generalized edema: Secondary | ICD-10-CM

## 2012-08-01 DIAGNOSIS — K7030 Alcoholic cirrhosis of liver without ascites: Secondary | ICD-10-CM

## 2012-08-01 DIAGNOSIS — F1020 Alcohol dependence, uncomplicated: Secondary | ICD-10-CM

## 2012-08-01 DIAGNOSIS — K7460 Unspecified cirrhosis of liver: Secondary | ICD-10-CM | POA: Insufficient documentation

## 2012-08-02 ENCOUNTER — Encounter: Payer: Self-pay | Admitting: Family Medicine

## 2012-08-02 ENCOUNTER — Ambulatory Visit (INDEPENDENT_AMBULATORY_CARE_PROVIDER_SITE_OTHER): Payer: BC Managed Care – PPO | Admitting: Family Medicine

## 2012-08-02 VITALS — BP 152/95 | HR 107 | Temp 98.9°F | Ht 65.0 in | Wt 220.0 lb

## 2012-08-02 DIAGNOSIS — R0601 Orthopnea: Secondary | ICD-10-CM | POA: Insufficient documentation

## 2012-08-02 DIAGNOSIS — I10 Essential (primary) hypertension: Secondary | ICD-10-CM

## 2012-08-02 DIAGNOSIS — R0989 Other specified symptoms and signs involving the circulatory and respiratory systems: Secondary | ICD-10-CM

## 2012-08-02 DIAGNOSIS — R0600 Dyspnea, unspecified: Secondary | ICD-10-CM

## 2012-08-02 DIAGNOSIS — K7030 Alcoholic cirrhosis of liver without ascites: Secondary | ICD-10-CM

## 2012-08-02 DIAGNOSIS — K7031 Alcoholic cirrhosis of liver with ascites: Secondary | ICD-10-CM

## 2012-08-02 DIAGNOSIS — E785 Hyperlipidemia, unspecified: Secondary | ICD-10-CM

## 2012-08-02 LAB — COMPREHENSIVE METABOLIC PANEL
ALT: 34 U/L (ref 0–53)
AST: 79 U/L — ABNORMAL HIGH (ref 0–37)
Albumin: 2.4 g/dL — ABNORMAL LOW (ref 3.5–5.2)
BUN: 13 mg/dL (ref 6–23)
CO2: 29 mEq/L (ref 19–32)
Calcium: 7.9 mg/dL — ABNORMAL LOW (ref 8.4–10.5)
Chloride: 92 mEq/L — ABNORMAL LOW (ref 96–112)
Creatinine, Ser: 0.8 mg/dL (ref 0.4–1.5)
GFR: 104.48 mL/min (ref >60.00)
Potassium: 4.1 mEq/L (ref 3.5–5.1)

## 2012-08-02 NOTE — Patient Instructions (Signed)
Limit sodium intake to 2000 mg per day. Limit fluid intake to 2 liters per day.  Avoid all OTC pain meds (such as ibuprofen, alleve, motrin, advil, tylenol).

## 2012-08-02 NOTE — Assessment & Plan Note (Signed)
His statin has been d/c'd due to his liver dysfunction.

## 2012-08-02 NOTE — Assessment & Plan Note (Addendum)
His fluid accumulation does not appear any different but he is down 18 lbs over the last 1 wk. Continue lasix 40 mg daily but getting fluid out of his interstitial space will be very difficult given his hypoalbuminemia. Recheck CMET today, plus check PT/INR. Will check CXR and schedule echocardiogram with his latest complaint of orthopnea/PND and supine cough. I think his liver dysfunction is moderate.  Fortunately, he has no signs of SBP or GI bleeding. He has GI appt on 2/11 but I'll see him again in 1 wk. I'll call pt with results and any change in rec's after lab results are in.  Anticipate getting him on aldactone soon.   He is fully committed to remaining abstinent and being compliant with any MD recommendations. Advised pt to avoid ASA, NSAIDs and tylenol, also limit fluids to 2L per day and limit sodium to 2 g/day. Answered many questions today regarding dx, plan, diet, meds, possibility of getting on transplant list, etc.

## 2012-08-02 NOTE — Assessment & Plan Note (Addendum)
I have been having him hold his lisin/hctz. BP up today. 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  Will avoid ACE-I, ARB, and HCTZ in this pt.

## 2012-08-02 NOTE — Progress Notes (Signed)
OFFICE NOTE  08/02/2012  CC:  Chief Complaint  Patient presents with  . Follow-up    anasarca     HPI: Patient is a 62 y.o. Caucasian male who is here for 1 wk f/u anasarca.   Abd u/s since last visit: confirmed cirrhosis with moderate volume ascites.  Labs showed slightly low K so I started him on KDUR 40 mEQ qd.  Reviewed all other recent labs with pt again today; moderate liver dysfunction, mild hyponatrema and hypokalemia, CBC normal except MCV a bit high. He has been taking lasix 40mg  daily for the last week. He has been trying to watch his sodium intake.  Has initial appt with Dr. Rhea Belton at [facility] on 08/13/12.  Onset of cough a few nights ago only while in supine position, impairs sleep.  Propping up on pillows helps.  Goes away when he sits up-takes about to resolve.  Some clear phlegm production is noted.  No hemoptysis. No fever, no abd pain, no n/v/d.  No chest pain.  No tremor.   Pertinent PMH:  Past Medical History  Diagnosis Date  . Alcoholism     Records show he was on this as early as 12/2007.  Marland Kitchen Hypertension   . Hyperlipidemia   . Degenerative disc disease, lumbar   . History of testicular cancer 1979    Testicle removed, extensive abd/pelvic lymph lymph node dissection  . Erectile dysfunction   . Tobacco dependence   . Peripheral edema     legs.  07/2012 bilateral venous/art dopplers via ortho were neg for DVT or Lesperance's cyst but interstitial fluid noted by ultrasonographer  . Chronic low back pain   . 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  . 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  MEDS: Currently taking only lasix 40mg  qd and KDUR 40 mEQ daily  PE: Blood pressure 152/95, pulse 107, temperature 98.9 F (37.2 C), temperature source Temporal, height 5\' 5"   (1.651 m), weight 220 lb (99.791 kg), SpO2 97.00%. Wt was 238 lbs on 07/26/12. Gen: alert, pleasant, oriented x 4, NAD. ENT: no icterus.  Oral mucosa is pink/moist. CV: Regular, tachy to 110-120, no murmur. LUNGS: CTA bilat, mildly diminished BS diffusely, nonlabored resps. ABD: soft, nontender.  He is markedly distended but is not tense.  He has pitting of the skin all the way up into his chest wall.  + Flank dullness.  No mass, no bruit, no organomegaly. EXT: 3++ pitting edema bilat up to knees, with less pitting the further you go up his legs.  Fine superficial desquamation of LE skin anteriorly, with mild pinkish hue but no erythema or excessive warmth. SKIN: no jaundice  IMPRESSION AND PLAN:  Alcoholic cirrhosis of liver with ascites His fluid accumulation does not appear any different but he is down 18 lbs over the last 1 wk. Continue lasix 40 mg daily but getting fluid out of his interstitial space will be very difficult given his hypoalbuminemia. Recheck CMET today, plus check PT/INR. Will check CXR and schedule echocardiogram with his latest complaint of orthopnea/PND and supine cough. I think his liver dysfunction is moderate.  Fortunately, he has no signs of SBP or GI bleeding. He has GI appt on 2/11 but I'll see him again in 1 wk. I'll call pt with  results and any change in rec's after lab results are in.  Anticipate getting him on aldactone soon.   He is fully committed to remaining abstinent and being compliant with any MD recommendations. Advised pt to avoid ASA, NSAIDs and tylenol, also limit fluids to 2L per day and limit sodium to 2 g/day. Answered many questions today regarding dx, plan, diet, meds, possibility of getting on transplant list, etc.   HYPERTENSION I have been having him hold his lisin/hctz. BP up today. 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   Will avoid ACE-I, ARB, and HCTZ in this pt.   HYPERLIPIDEMIA His statin has been d/c'd due to his liver dysfunction.   FOLLOW UP: 1 wk

## 2012-08-03 ENCOUNTER — Ambulatory Visit (HOSPITAL_COMMUNITY): Admission: RE | Admit: 2012-08-03 | Payer: BC Managed Care – PPO | Source: Ambulatory Visit

## 2012-08-03 ENCOUNTER — Ambulatory Visit (HOSPITAL_COMMUNITY)
Admission: RE | Admit: 2012-08-03 | Discharge: 2012-08-03 | Disposition: A | Discharge status: Home / Self Care | Payer: BC Managed Care – PPO | Source: Ambulatory Visit | Attending: Family Medicine | Admitting: Family Medicine

## 2012-08-03 ENCOUNTER — Other Ambulatory Visit: Payer: Self-pay | Admitting: Family Medicine

## 2012-08-03 DIAGNOSIS — J9819 Other pulmonary collapse: Secondary | ICD-10-CM | POA: Insufficient documentation

## 2012-08-03 DIAGNOSIS — R609 Edema, unspecified: Secondary | ICD-10-CM

## 2012-08-03 DIAGNOSIS — E8779 Other fluid overload: Secondary | ICD-10-CM | POA: Insufficient documentation

## 2012-08-03 DIAGNOSIS — R059 Cough, unspecified: Secondary | ICD-10-CM | POA: Insufficient documentation

## 2012-08-03 DIAGNOSIS — R05 Cough: Secondary | ICD-10-CM | POA: Insufficient documentation

## 2012-08-03 MED ORDER — PROPRANOLOL HCL 40 MG PO TABS: 40.0000 mg | ORAL_TABLET | Freq: Two times a day (BID) | ORAL | Status: DC

## 2012-08-03 MED ORDER — SPIRONOLACTONE 100 MG PO TABS: 100.0000 mg | ORAL_TABLET | Freq: Every day | ORAL | Status: DC

## 2012-08-08 ENCOUNTER — Encounter: Payer: Self-pay | Admitting: Family Medicine

## 2012-08-08 ENCOUNTER — Ambulatory Visit (INDEPENDENT_AMBULATORY_CARE_PROVIDER_SITE_OTHER): Payer: BC Managed Care – PPO | Admitting: Family Medicine

## 2012-08-08 ENCOUNTER — Encounter: Payer: Self-pay | Admitting: Internal Medicine

## 2012-08-08 VITALS — BP 112/75 | HR 71 | Temp 98.5°F | Ht 65.0 in | Wt 239.0 lb

## 2012-08-08 DIAGNOSIS — K7030 Alcoholic cirrhosis of liver without ascites: Secondary | ICD-10-CM

## 2012-08-08 DIAGNOSIS — L538 Other specified erythematous conditions: Secondary | ICD-10-CM

## 2012-08-08 DIAGNOSIS — R609 Edema, unspecified: Secondary | ICD-10-CM

## 2012-08-08 DIAGNOSIS — L304 Erythema intertrigo: Secondary | ICD-10-CM

## 2012-08-08 DIAGNOSIS — R601 Generalized edema: Secondary | ICD-10-CM

## 2012-08-08 DIAGNOSIS — K7031 Alcoholic cirrhosis of liver with ascites: Secondary | ICD-10-CM

## 2012-08-08 LAB — CBC WITH DIFFERENTIAL/PLATELET
Eosinophils Relative: 0.4 % (ref 0.0–5.0)
Hemoglobin: 12.6 g/dL — ABNORMAL LOW (ref 13.0–17.0)
MCHC: 34.1 g/dL (ref 30.0–36.0)
MCV: 104.2 fl — ABNORMAL HIGH (ref 78.0–100.0)
Monocytes Relative: 12.5 % — ABNORMAL HIGH (ref 3.0–12.0)
RBC: 3.54 Mil/uL — ABNORMAL LOW (ref 4.22–5.81)
RDW: 13.9 % (ref 11.5–14.6)

## 2012-08-08 LAB — COMPREHENSIVE METABOLIC PANEL
ALT: 28 U/L (ref 0–53)
Albumin: 2.2 g/dL — ABNORMAL LOW (ref 3.5–5.2)
CO2: 29 mEq/L (ref 19–32)
Chloride: 95 mEq/L — ABNORMAL LOW (ref 96–112)
GFR: 98.75 mL/min (ref >60.00)
Glucose, Bld: 80 mg/dL (ref 70–99)
Potassium: 4.7 mEq/L (ref 3.5–5.1)
Sodium: 129 mEq/L — ABNORMAL LOW (ref 135–145)
Total Protein: 5.3 g/dL — ABNORMAL LOW (ref 6.0–8.3)

## 2012-08-08 LAB — PROTIME-INR: Prothrombin Time: 11.9 s (ref 10.2–12.4)

## 2012-08-08 MED ORDER — FUROSEMIDE 40 MG PO TABS: ORAL_TABLET | ORAL | Status: DC

## 2012-08-08 MED ORDER — CLOTRIMAZOLE-BETAMETHASONE 1-0.05 % EX CREA: TOPICAL_CREAM | Freq: Two times a day (BID) | CUTANEOUS | Status: DC

## 2012-08-08 NOTE — Progress Notes (Signed)
OFFICE VISIT  08/11/2012   CC:  Chief Complaint  Patient presents with  . Follow-up    ascites     HPI:    Patient is a 62 y.o. Caucasian male who presents for 1 wk f/u cirrhosis with ascites.   Since last visit I d/c'd his potassium, added aldactone and propranolol, and restricted him to 2L fluid/day and emphasized low Na diet.  He is compliant with meds and fluid/Na restrictions but he has regained weight--he is back at the weight he was at when we initiated lasix. He says a couple of days ago he had significant improvement in his abd and leg swelling.  Then yesterday he began swelling severely again.  Denies abdominal pain.  Still c/o lots of cough, has to sit upright or he cannot sleep due to excessive cough with phlegm production.  Says no blood in sputum.  Says sputum is clear and not particularly thick or sticky.  Denies palpitations, chest pains, or fevers.  He is urinating frequently, says urine appears clear and light yellow.  Denies melena or hematochezia. SKIN: groin and scrotum with diffuse mild erythematous hue and flakiness to the skin.  This is clearly affecting opposing skin surfaces, without maceration but with mild tenderness to palpation.  No LAD detected.  Past Medical History  Diagnosis Date  . Alcoholism     Records show he was on this as early as 12/2007.  Marland Kitchen Hypertension   . Hyperlipidemia   . Degenerative disc disease, lumbar   . History of testicular cancer 1979    Testicle removed, extensive abd/pelvic lymph lymph node dissection  . Erectile dysfunction   . Tobacco dependence   . Peripheral edema     legs.  07/2012 bilateral venous/art dopplers via ortho were neg for DVT or Urquidi's cyst but interstitial fluid noted by ultrasonographer  . Chronic low back pain   . 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  . 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 Appendectomy    . Lumbar disc surgery  01/20/2009    Dr.'s Shelle Iron and Gioffre (L5/S1 hemilaminectomy)    Outpatient Prescriptions Prior to Visit  Medication Sig Dispense Refill  . Multiple Vitamin (MULTIVITAMIN) tablet Take 1 tablet by mouth daily.      . propranolol (INDERAL) 40 MG tablet Take 1 tablet (40 mg total) by mouth 2 (two) times daily.  60 tablet  1  . spironolactone (ALDACTONE) 100 MG tablet Take 1 tablet (100 mg total) by mouth daily.  30 tablet  1  . furosemide (LASIX) 40 MG tablet 1 tab po qd  30 tablet  3   No facility-administered medications prior to visit.    No Known Allergies  ROS As per HPI  PE: Blood pressure 112/75, pulse 71, temperature 98.5 F (36.9 C), temperature source Temporal, height 5\' 5"  (1.651 m), weight 239 lb (108.41 kg), SpO2 98.00%. Gen: Alert, well appearing.  Patient is oriented to person, place, time, and situation. No jaundice or pallor. CV: RRR, no m/r/g LUNGS: CTA bilat, nonlabored resps ABD: marked distention, obvious ascites, no palpable organomegaly or mass, no bruit, no tenderness.  BS normal. He has some ecchymoses beneath inferior rib margins that have cleared up some since last visit but has extensive new ecchymoses in left side of trunk.  No palpable hematoma, no  tenderness.   EXT: 3++ pitting edema bilat from upper thighs down to feet.   He also has pitting edema on trunk up to the level of his lower chest.  LABS:  None today  IMPRESSION AND PLAN:  Alcoholic cirrhosis of liver with ascites Wt is back up. Encouraged sodium restriction once again as well as 2 L/day fluid restriction. Increase lasix to 40mg  bid, continue aldactone 100 mg qd and propranolol 40mg  bid. Check CMET, PT/INR, CBC, and BNP. Keep GI initial eval 08/13/12.   F/u with me in 2 wks, earlier if worsening.  Anasarca Secondary to liver failure/cirrhosis/hypoalbuminemia. CHF could be contributing: echocardiogram is  ordered. No CP/sign of ACS, so likely chronic if present (alcoholic dilated cardiomyopathy?).  Intertrigo Lotrizone cream to groin/genitals. Attempting to minimize edema with fluid restriction, sodium limitation, and diuretics.   An After Visit Summary was printed and given to the patient.  FOLLOW UP: Return in about 2 weeks (around 08/22/2012) for f/u cirrhosis with ascites.

## 2012-08-10 NOTE — Progress Notes (Signed)
Quick Note:  BNP elevation noted. Pt had recent CXR which did not show pulm edema. He has an echocardiogram already scheduled. My suspicion is that he does have some heart failure (dilated cardiomyopathy assoc with alcoholism?) contributing to his clinical condition. I feel that like his diuretic regimen is appropriate at this time. ______

## 2012-08-11 DIAGNOSIS — L304 Erythema intertrigo: Secondary | ICD-10-CM | POA: Insufficient documentation

## 2012-08-11 NOTE — Assessment & Plan Note (Signed)
Lotrizone cream to groin/genitals. Attempting to minimize edema with fluid restriction, sodium limitation, and diuretics.

## 2012-08-11 NOTE — Assessment & Plan Note (Signed)
Secondary to liver failure/cirrhosis/hypoalbuminemia. CHF could be contributing: echocardiogram is ordered. No CP/sign of ACS, so likely chronic if present (alcoholic dilated cardiomyopathy?).

## 2012-08-11 NOTE — Assessment & Plan Note (Addendum)
Wt is back up. Encouraged sodium restriction once again as well as 2 L/day fluid restriction. Increase lasix to 40mg  bid, continue aldactone 100 mg qd and propranolol 40mg  bid. Check CMET, PT/INR, CBC, and BNP. Keep GI initial eval 08/13/12.   F/u with me in 2 wks, earlier if worsening.

## 2012-08-13 ENCOUNTER — Telehealth: Payer: Self-pay | Admitting: Gastroenterology

## 2012-08-13 ENCOUNTER — Encounter: Payer: Self-pay | Admitting: Internal Medicine

## 2012-08-13 ENCOUNTER — Ambulatory Visit (INDEPENDENT_AMBULATORY_CARE_PROVIDER_SITE_OTHER): Payer: BC Managed Care – PPO | Admitting: Internal Medicine

## 2012-08-13 ENCOUNTER — Other Ambulatory Visit (INDEPENDENT_AMBULATORY_CARE_PROVIDER_SITE_OTHER): Payer: BC Managed Care – PPO

## 2012-08-13 VITALS — BP 102/60 | HR 67 | Ht 65.0 in | Wt 219.0 lb

## 2012-08-13 DIAGNOSIS — K7460 Unspecified cirrhosis of liver: Secondary | ICD-10-CM

## 2012-08-13 DIAGNOSIS — K766 Portal hypertension: Secondary | ICD-10-CM

## 2012-08-13 DIAGNOSIS — F1010 Alcohol abuse, uncomplicated: Secondary | ICD-10-CM

## 2012-08-13 DIAGNOSIS — Z23 Encounter for immunization: Secondary | ICD-10-CM

## 2012-08-13 DIAGNOSIS — R188 Other ascites: Secondary | ICD-10-CM

## 2012-08-13 LAB — PROTIME-INR
INR: 1.2 ratio — ABNORMAL HIGH (ref 0.8–1.0)
Prothrombin Time: 12.3 s (ref 10.2–12.4)

## 2012-08-13 LAB — CBC
HCT: 36 % — ABNORMAL LOW (ref 39.0–52.0)
Hemoglobin: 12.2 g/dL — ABNORMAL LOW (ref 13.0–17.0)
MCHC: 33.9 g/dL (ref 30.0–36.0)
Platelets: 290 10*3/uL (ref 150.0–400.0)

## 2012-08-13 LAB — COMPREHENSIVE METABOLIC PANEL
Alkaline Phosphatase: 113 U/L (ref 39–117)
BUN: 15 mg/dL (ref 6–23)
Creatinine, Ser: 0.8 mg/dL (ref 0.4–1.5)
Glucose, Bld: 90 mg/dL (ref 70–99)
Total Bilirubin: 1.6 mg/dL — ABNORMAL HIGH (ref 0.3–1.2)

## 2012-08-13 MED ORDER — SPIRONOLACTONE 100 MG PO TABS: 200.0000 mg | ORAL_TABLET | Freq: Every day | ORAL | Status: DC

## 2012-08-13 NOTE — Progress Notes (Signed)
Patient ID: Jared Garrett, male   DOB: 08/26/1950, 62 y.o.   MRN: [id number]  SUBJECTIVE: HPI Jared Garrett is a 62 year old male with a past medical history of alcohol abuse and a new diagnosis of cirrhosis with portal hypertension manifested by ascites, remote history of testicular cancer status post orchiectomy and lymph node dissection, hypertension, hyperlipidemia, degenerative disc disease, tobacco use who seen in consultation at the request of Dr. Milinda Cave for management/evaluation of liver disease/cirrhosis.  The patient is alone today. He reports a long history of alcohol use for decades. He reports that his job is very stressful and he drank alcohol most days. For years this was liquor, but he also reports drinking beer. He reports that he sore to his wife he would drink no further block after May 2012 and he is This promise, but he did continue to drink beer on a frequent basis. His last drink was in the first week of January 2014. He reports being told over the years that he needed to cut his drinking back or that he was going to develop liver disease, though he was never told he had cirrhosis until very recently. He reports in early January developing lower extremity edema and increasing abdominal girth. He also had significant scrotal edema. He was started on diuretic therapy by Dr. Milinda Cave, and remains on furosemide 80 mg daily and spironolactone 100 mg daily. He has been monitoring his sodium intake strictly in trying to stay below 2 g daily. He also has been fluid restricting.  The edema last ascites has fluctuated some point, but over the last week or so he has improved significantly. He still has issues with lower extremity swelling and ascites. He denies abdominal pain. His energy level is low but otherwise he feels well. He denies nausea or vomiting. No black or tarry stool. No history of jaundice that he is aware of. No issues with confusion. He does have a family history significant for alcohol  abuse in the maternal uncle and father. He reports having attended AA a few times and the distant past but none recently. He also reports having alcohol detox 4-5 years ago. 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    He did have an abdominal/liver ultrasound and has an echocardiogram pending  Review of Systems  As per history of present illness, otherwise negative   Past Medical History  Diagnosis Date  . Alcoholism     Records show he was on this as early as 12/2007.  Marland Kitchen Hypertension   . Hyperlipidemia   . Degenerative disc disease, lumbar   . History of testicular cancer 1979    Testicle removed, extensive abd/pelvic lymph lymph node dissection  . Erectile dysfunction   . Tobacco dependence   . Peripheral edema     legs.  07/2012 bilateral venous/art dopplers via ortho were neg for DVT or Kresse's cyst but interstitial fluid noted by ultrasonographer  . Chronic low back pain   . 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  . Alcoholic cirrhosis of liver with ascites 07/2012    Current Outpatient Prescriptions  Medication Sig Dispense Refill  . clotrimazole-betamethasone (LOTRISONE) cream Apply topically 2 (two) times daily. (apply to groin/scrotal region)  30 g  3  . furosemide (LASIX) 40 MG tablet Take 40 mg by  mouth 2 (two) times daily.      . Multiple Vitamin (MULTIVITAMIN) tablet Take 1 tablet by mouth daily.      . propranolol (INDERAL) 40 MG tablet Take 1 tablet (40 mg total) by mouth 2 (two) times daily.  60 tablet  1  . spironolactone (ALDACTONE) 100 MG tablet Take 2 tablets (200 mg total) by mouth daily.  30 tablet  3   No current facility-administered medications for this visit.    No Known Allergies  Family History  Problem Relation Age of Onset  . Heart disease Father     History  Substance Use  Topics  . Smoking status: Current Every Day Smoker -- 0.20 packs/day    Types: Cigarettes  . Smokeless tobacco: Never Used  . Alcohol Use: No    OBJECTIVE: BP 102/60  Pulse 67  Ht 5\' 5"  (1.651 m)  Wt 219 lb (99.338 kg)  BMI 36.44 kg/m2 Constitutional: Well-developed and well-nourished. No distress. HEENT: Normocephalic and atraumatic. Oropharynx is clear and moist. No oropharyngeal exudate. Conjunctivae are normal. No scleral icterus. Neck: Neck supple. Trachea midline. Cardiovascular: Normal rate, regular rhythm and intact distal pulses.  Pulmonary/chest: Effort normal and breath sounds normal. Lungs are clear in the bases there is mild end expiratory wheeze but no rales or rhonchi Abdominal: Soft, moderate distention with anasarca and bulging flanks, well-healed midline abdominal scar, nontender, bowel sounds active throughout. No gross hepatomegaly though exam somewhat limited by edema Extremities: no clubbing, cyanosis, no palmar erythema.  2+ lower extremity edema to the thigh Lymphadenopathy: No cervical adenopathy noted. Neurological: Alert and oriented to person place and time. No asterixis Skin: Skin is warm and dry.  Psychiatric: Normal mood and affect. Behavior is normal.  Labs and Imaging -- CBC    Component Value Date/Time   WBC 10.5 08/08/2012 1409   RBC 3.54* 08/08/2012 1409   HGB 12.6* 08/08/2012 1409   HCT 36.9* 08/08/2012 1409   PLT 255.0 08/08/2012 1409   MCV 104.2* 08/08/2012 1409   MCH 35.5* 07/26/2012 1606   MCHC 34.1 08/08/2012 1409   RDW 13.9 08/08/2012 1409   LYMPHSABS 1.2 08/08/2012 1409   MONOABS 1.3* 08/08/2012 1409   EOSABS 0.0 08/08/2012 1409   BASOSABS 0.0 08/08/2012 1409    CMP     Component Value Date/Time   NA 129* 08/08/2012 1409   K 4.7 08/08/2012 1409   CL 95* 08/08/2012 1409   CO2 29 08/08/2012 1409   GLUCOSE 80 08/08/2012 1409   GLUCOSE 101* 05/28/2006 0830   BUN 10 08/08/2012 1409   CREATININE 0.8 08/08/2012 1409   CREATININE 0.67 07/26/2012 1606   CALCIUM 8.4  08/08/2012 1409   PROT 5.3* 08/08/2012 1409   ALBUMIN 2.2* 08/08/2012 1409   AST 57* 08/08/2012 1409   ALT 28 08/08/2012 1409   ALKPHOS 144* 08/08/2012 1409   BILITOT 2.4* 08/08/2012 1409   GFRNONAA >60 01/22/2009 0535   GFRAA  Value: >60        The eGFR has been calculated using the MDRD equation. This calculation has not been validated in all clinical situations. eGFR's persistently <60 mL/min signify possible Chronic Kidney Disease. 01/22/2009 0535    INR 1.2  MELD 12  COMPLETE ABDOMINAL ULTRASOUND -- 08/01/2012   Comparison:  Abdominal ultrasound 06/12/2005.   Findings:   Gallbladder:  Gallbladder sludge is seen.  There are no gallstones. Minimal gallbladder wall thickening is likely related to ascites. Sonographer reports negative Murphy's sign.   Common bile duct:  Measures 0.5 cm.   Liver:  The liver appears shrunken with a nodular border.  Moderate volume of abdominal ascites is identified.  No focal liver lesion is seen. There is hepatopetal flow in the portal vein.   IVC:  Appears normal.   Pancreas:  No focal abnormality seen.   Spleen:  Measures 9.4 cm.  Single small calcification is noted.  No worrisome splenic lesion.  Small left pleural effusion is identified.   Right Kidney:  Measures 11.8 cm and appears normal.   Left Kidney:  Measures 10.7 cm and appears normal.   Abdominal aorta:  No aneurysm identified.   IMPRESSION:   1.  Cirrhosis with associated moderate volume of abdominal ascites. No focal liver lesion identified. 2.  Gallbladder sludge without evidence of cholecystitis. 3.  Small left pleural effusion.   ASSESSMENT AND PLAN:  62 year old male with a past medical history of alcohol abuse and a new diagnosis of cirrhosis with portal hypertension manifested by ascites, remote history of testicular cancer status post orchiectomy and lymph node dissection, hypertension, hyperlipidemia, degenerative disc disease, tobacco use who seen in consultation at the  request of Dr. Milinda Cave for management/evaluation of liver disease/cirrhosis.  45 minutes was spent today directly with me discussing with the patient his diagnosis of cirrhosis with portal hypertension, in addition to physical examination.  1.  Alcoholic cirrhosis with portal hypertension, manifested by ascites and lower extremity edema -- we spent a great deal of time today discussing cirrhosis and portal hypertension. He is coming to terms with the diagnosis and the implications thereof.  He has been alcohol abstinent for greater than one month, and I made him aware of my strong recommendation to remain completely abstinent.  He asked what he can do, and my recommendation is focus intensely on alcohol abstinence.  I do think he will need help in remaining alcohol free and I have encouraged AA meetings and also establishing care with an alcohol counselor (he has agreed to a referral to psychology with Dr. Dellia Cloud, which we will work to arrange).  I have encouraged honesty with his wife and family both regarding his alcohol problem, commitment to abstinence, and new diagnosis of cirrhosis.   --Regarding his ascites, he has been started on diuretics, with some decrease in his serum sodium.  We will need to monitor the serum sodium closely as he continues diuretic therapy. If his sodium drops much further, we may have to reduce the dose of Lasix. We also discussed how in some patients hyponatremia can prevent the use of diuretics entirely.  I have recommended that we work to maintain a 40:100 ratio of Lasix: Aldactone.  This helps maintain normokalemia. I've encouraged that he weighed himself daily at home, record these weights, and notify either myself or Dr. Milinda Cave of any increase of greater than 5 lbs. He is already aware of the need for sodium restriction, and I've encouraged him to maintain a low-sodium diet (goal less than 1.5 g daily) --CMP, CBC, INR today --Followup in one month --MELD score,  currently 12.   --We discussed transplant very briefly.  I made him aware that before he could be considered for transplantation at any center he will need documented alcohol abstinence for at least 6 months including alcohol rehabilitation counseling.  The thought of transplant frightens him very much, but I felt it necessary to mention the transplant is sometimes necessary to prolong life in the management of cirrhosis  2. Health maintenance in cirrhosis --  Vaccines -  Hep A -- testing immunity today  Hep B -- testing immunity today  Flu -- given today  Pneumovax - unknown, address at next visit  HCC screening - [place] Jan 2014 - up to date  Variceal screening - needs EGD, would like vol overload better controlled 1st.  Schedule after next office visit.  Already on beta blocker, started by PCP  3.  CRC screening --he reports screening colonoscopy performed in approximately 2009, reportedly normal.  We will try to obtain this report

## 2012-08-13 NOTE — Telephone Encounter (Signed)
Lvm for Dr. Dellia Cloud to call me back to set up referral for pt.  Voicemail said he will be out of the office until 08/19/2012

## 2012-08-13 NOTE — Patient Instructions (Addendum)
You have been given a separate informational sheet regarding your tobacco use, the importance of quitting and local resources to help you quit. You have been given a flu vaccine today  Your physician has requested that you go to the basement for  lab work before leaving today.  Increase Aldactone to 200Mg  daily and continue Lasix daily  Follow up with Dr. Rhea Belton in 1 month   Ethanol This is a test of the blood alcohol level and is used to determine whether your level will impair your driving or other activities and also to determine the possibility of overdose (alcohol poisoning). PREPARATION FOR TEST No preparation or fasting is necessary. NORMAL FINDINGS  Negative  Possible critical values are greater than 300 mg/dl or greater than 65 mmol/L (SI units). Ranges for normal findings may vary among different laboratories and hospitals. You should always check with your doctor after having lab work or other tests done to discuss the meaning of your test results and whether your values are considered within normal limits. MEANING OF TEST  Your caregiver will go over the test results with you and discuss the importance and meaning of your results, as well as treatment options and the need for additional tests if necessary. OBTAINING THE TEST RESULTS  It is your responsibility to obtain your test results. Ask the lab or department performing the test when and how you will get your results. Document Released: 07/12/2004 Document Revised: 09/11/2011 Document Reviewed: 05/29/2008 [employer] Patient Information 2013 [place], [place].  1.5 Gram Low Sodium Diet A 1.5 gram sodium diet restricts the amount of sodium in the diet to no more than 1.5 g or 1500 mg daily. The American Heart Association recommends Americans over the age of 47 to consume no more than 1500 mg of sodium each day to reduce the risk of developing high blood pressure. Research also shows that limiting sodium may reduce heart attack  and stroke risk. Many foods contain sodium for flavor and sometimes as a preservative. When the amount of sodium in a diet needs to be low, it is important to know what to look for when choosing foods and drinks. The following includes some information and guidelines to help make it easier for you to adapt to a low sodium diet. QUICK TIPS  Do not add salt to food.  Avoid convenience items and fast food.  Choose unsalted snack foods.  Buy lower sodium products, often labeled as "lower sodium" or "no salt added."  Check food labels to learn how much sodium is in 1 serving.  When eating at a restaurant, ask that your food be prepared with less salt or none, if possible. READING FOOD LABELS FOR SODIUM INFORMATION The nutrition facts label is a good place to find how much sodium is in foods. Look for products with no more than 400 mg of sodium per serving. Remember that 1.5 g = 1500 mg. The food label may also list foods as:  Sodium-free: Less than 5 mg in a serving.  Very low sodium: 35 mg or less in a serving.  Low-sodium: 140 mg or less in a serving.  Light in sodium: 50% less sodium in a serving. For example, if a food that usually has 300 mg of sodium is changed to become light in sodium, it will have 150 mg of sodium.  Reduced sodium: 25% less sodium in a serving. For example, if a food that usually has 400 mg of sodium is changed to reduced sodium, it will  have 300 mg of sodium. CHOOSING FOODS Grains  Avoid: Salted crackers and snack items. Some cereals, including instant hot cereals. Bread stuffing and biscuit mixes. Seasoned rice or pasta mixes.  Choose: Unsalted snack items. Low-sodium cereals, oats, puffed wheat and rice, shredded wheat. English muffins and bread. Pasta. Meats  Avoid: Salted, canned, smoked, spiced, pickled meats, including fish and poultry. Bacon, ham, sausage, cold cuts, hot dogs, anchovies.  Choose: Low-sodium canned tuna and salmon. Fresh or frozen  meat, poultry, and fish. Dairy  Avoid: Processed cheese and spreads. Cottage cheese. Buttermilk and condensed milk. Regular cheese.  Choose: Milk. Low-sodium cottage cheese. Yogurt. Sour cream. Low-sodium cheese. Fruits and Vegetables  Avoid: Regular canned vegetables. Regular canned tomato sauce and paste. Frozen vegetables in sauces. Olives. Rosita Fire. Relishes. Sauerkraut.  Choose: Low-sodium canned vegetables. Low-sodium tomato sauce and paste. Frozen or fresh vegetables. Fresh and frozen fruit. Condiments  Avoid: Canned and packaged gravies. Worcestershire sauce. Tartar sauce. Barbecue sauce. Soy sauce. Steak sauce. Ketchup. Onion, garlic, and table salt. Meat flavorings and tenderizers.  Choose: Fresh and dried herbs and spices. Low-sodium varieties of mustard and ketchup. Lemon juice. Tabasco sauce. Horseradish. SAMPLE 1.5 GRAM SODIUM MEAL PLAN Breakfast / Sodium (mg)  1 cup low-fat milk / 143 mg  1 whole-wheat English muffin / 240 mg  1 tbs heart-healthy margarine / 153 mg  1 hard-boiled egg / 139 mg  1 small orange / 0 mg Lunch / Sodium (mg)  1 cup raw carrots / 76 mg  2 tbs no salt added peanut butter / 5 mg  2 slices whole-wheat bread / 270 mg  1 tbs jelly / 6 mg   cup red grapes / 2 mg Dinner / Sodium (mg)  1 cup whole-wheat pasta / 2 mg  1 cup low-sodium tomato sauce / 73 mg  3 oz lean ground beef / 57 mg  1 small side salad (1 cup raw spinach leaves,  cup cucumber,  cup yellow bell pepper) with 1 tsp olive oil and 1 tsp red wine vinegar / 25 mg Snack / Sodium (mg)  1 container low-fat vanilla yogurt / 107 mg  3 graham cracker squares / 127 mg Nutrient Analysis  Calories: 1745  Protein: 75 g  Carbohydrate: 237 g  Fat: 57 g  Sodium: 1425 mg Document Released: 06/19/2005 Document Revised: 09/11/2011 Document Reviewed: 09/20/2009 [facility] Patient Information 2013 [place], [place].   We will contact you regarding your referral for Alcohol  counseling

## 2012-08-14 ENCOUNTER — Telehealth: Payer: Self-pay | Admitting: Internal Medicine

## 2012-08-14 LAB — HEPATITIS B SURFACE ANTIBODY, QUANTITATIVE: Hepatitis B-Post: 0 m[IU]/mL

## 2012-08-14 LAB — HEPATITIS A ANTIBODY, TOTAL: Hep A Total Ab: POSITIVE — AB

## 2012-08-14 LAB — HEPATITIS B CORE ANTIBODY, TOTAL: Hep B Core Total Ab: NEGATIVE

## 2012-08-14 NOTE — Telephone Encounter (Signed)
Informed pt he may take the 2 aldactone tabs together in am per Dr Rhea Belton; pt stated understanding.

## 2012-08-14 NOTE — Telephone Encounter (Signed)
200 mg once daily (together in the morning) is fine. Thanks

## 2012-08-14 NOTE — Telephone Encounter (Signed)
Pt is confused about his meds; Aldactone and Lasix. He understands the Lasix 40 mg should be taken twice daily; but the Aldactone 100 mg says to take 2 tabs daily. He asked the pharmacist who could only read what was written but, he wants to take it correctly. Should pt take the Aldactone 100 mg BID? Thanks.         Pt was very complimentary about the ofc as a whole as well as the doctor!

## 2012-08-15 ENCOUNTER — Other Ambulatory Visit (HOSPITAL_COMMUNITY): Payer: BC Managed Care – PPO

## 2012-08-21 ENCOUNTER — Other Ambulatory Visit (HOSPITAL_COMMUNITY): Payer: BC Managed Care – PPO

## 2012-08-21 ENCOUNTER — Ambulatory Visit (HOSPITAL_COMMUNITY): Payer: BC Managed Care – PPO | Attending: Cardiology | Admitting: Radiology

## 2012-08-21 ENCOUNTER — Encounter: Payer: Self-pay | Admitting: Family Medicine

## 2012-08-21 DIAGNOSIS — R0989 Other specified symptoms and signs involving the circulatory and respiratory systems: Secondary | ICD-10-CM | POA: Insufficient documentation

## 2012-08-21 DIAGNOSIS — R609 Edema, unspecified: Secondary | ICD-10-CM | POA: Insufficient documentation

## 2012-08-21 DIAGNOSIS — R0601 Orthopnea: Secondary | ICD-10-CM | POA: Insufficient documentation

## 2012-08-21 HISTORY — PX: TRANSTHORACIC ECHOCARDIOGRAM: SHX275

## 2012-08-21 NOTE — Progress Notes (Signed)
Echocardiogram performed.  

## 2012-08-22 ENCOUNTER — Ambulatory Visit: Payer: BC Managed Care – PPO | Admitting: Family Medicine

## 2012-08-23 ENCOUNTER — Encounter: Payer: Self-pay | Admitting: Family Medicine

## 2012-08-23 ENCOUNTER — Ambulatory Visit (INDEPENDENT_AMBULATORY_CARE_PROVIDER_SITE_OTHER): Payer: BC Managed Care – PPO | Admitting: Family Medicine

## 2012-08-23 VITALS — BP 108/70 | HR 74 | Ht 65.0 in | Wt 183.0 lb

## 2012-08-23 LAB — COMPREHENSIVE METABOLIC PANEL
ALT: 14 U/L (ref 0–53)
AST: 30 U/L (ref 0–37)
CO2: 29 mEq/L (ref 19–32)
Creatinine, Ser: 0.9 mg/dL (ref 0.4–1.5)
GFR: 90.03 mL/min (ref >60.00)
Total Bilirubin: 1.7 mg/dL — ABNORMAL HIGH (ref 0.3–1.2)

## 2012-08-23 MED ORDER — HYDROXYZINE HCL 25 MG PO TABS: ORAL_TABLET | ORAL | Status: DC

## 2012-08-23 NOTE — Assessment & Plan Note (Signed)
This has resolved.

## 2012-08-23 NOTE — Assessment & Plan Note (Addendum)
Trial of hydroxyzine 25mg  qhs prn.

## 2012-08-23 NOTE — Assessment & Plan Note (Signed)
Recent echo normal. This is resolving nicely as ascites has improved significantly.

## 2012-08-23 NOTE — Assessment & Plan Note (Addendum)
Remarkable improvement/diuresis.  We talked today about reaching a plateau regarding wt loss/diuresis, want to avoid intravascular volume depletion.  He expressed understanding of this as well as the importance of monitoring electrolytes and kidney function.   He remains abstinent. He is doing well with Na and fluid restriction and he'll continue this. Continue current doses of meds, check CMET with mag and phos today. Keep f/u with Dr. Rhea Belton in about 2 wks. F/u with me in 1 mo.

## 2012-08-23 NOTE — Progress Notes (Signed)
OFFICE NOTE  08/23/2012  CC:  Chief Complaint  Patient presents with  . Follow-up    cirrhosis, ascites     HPI: Patient is a 62 y.o. Caucasian male who is here for 2 wk f/u alc cirrhosis with portal HTN/ascites. He has seen Dr. Rhea Belton, GI MD, since I last saw him--aldactone increased to 200mg  qd.  He has lost about 35 lbs in the last couple of weeks!   He feels much better with his ascites/leg edema improved.  Pertinent PMH:  Past Medical History  Diagnosis Date  . Alcoholism     Records show he was on this as early as 12/2007.  Marland Kitchen Hypertension   . Hyperlipidemia   . Degenerative disc disease, lumbar   . History of testicular cancer 1979    Testicle removed, extensive abd/pelvic lymph lymph node dissection  . Erectile dysfunction   . Tobacco dependence   . Peripheral edema     legs.  07/2012 bilateral venous/art dopplers via ortho were neg for DVT or Tant's cyst but interstitial fluid noted by ultrasonographer  . Chronic low back pain   . 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  . 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  MEDS:  Outpatient Prescriptions Prior to Visit  Medication Sig Dispense Refill  . clotrimazole-betamethasone (LOTRISONE) cream Apply topically 2 (two) times daily. (apply to groin/scrotal region)  30 g  3  . furosemide (LASIX) 40 MG tablet Take 40 mg by mouth 2 (two) times daily.      . Multiple Vitamin (MULTIVITAMIN) tablet Take 1 tablet by mouth daily.      . propranolol (INDERAL) 40 MG tablet Take 1 tablet (40 mg total) by mouth 2 (two) times daily.  60 tablet  1  . spironolactone (ALDACTONE) 100 MG tablet Take 2 tablets (200 mg total) by mouth daily.  30 tablet  3   No facility-administered medications prior to visit.    PE: Blood pressure 108/70, pulse 74,  height 5\' 5"  (1.651 m), weight 183 lb (83.008 kg). Gen: Alert, well appearing.  Patient is oriented to person, place, time, and situation. ENT: no icterus Oral mucosa pink and moist CV: RRR Lungs: CTA bilat. ABD: soft, no significant distention, nontender, BS normal. EXT: 1 + pitting edema from just below the knee down into feet bilat, no signif erythema/rash GU: intertrigo rash has resolved.  Scrotum minimally edematous, penile shaft fully visible.  Labs: none during office visit today Recent labs:    Chemistry      Component Value Date/Time   NA 132* 08/13/2012 1150   K 4.5 08/13/2012 1150   CL 97 08/13/2012 1150   CO2 28 08/13/2012 1150   BUN 15 08/13/2012 1150   CREATININE 0.8 08/13/2012 1150   CREATININE 0.67 07/26/2012 1606      Component Value Date/Time   CALCIUM 8.7 08/13/2012 1150   ALKPHOS 113 08/13/2012 1150   AST 41* 08/13/2012 1150   ALT 23 08/13/2012 1150   BILITOT 1.6* 08/13/2012 1150     Lab Results  Component Value Date   WBC 10.1 08/13/2012   HGB 12.2* 08/13/2012   HCT 36.0* 08/13/2012   MCV 101.5* 08/13/2012   PLT 290.0 08/13/2012   Lab Results  Component Value Date   INR 1.2* 08/13/2012  INR 1.1* 08/08/2012   INR 1.2* 08/02/2012   IMPRESSION AND PLAN:  Insomnia Trial of hydroxyzine 25mg  qhs prn.  Alcoholic cirrhosis of liver with ascites Remarkable improvement/diuresis.  We talked today about reaching a plateau regarding wt loss/diuresis, want to avoid intravascular volume depletion.  He expressed understanding of this as well as the importance of monitoring electrolytes and kidney function.   He remains abstinent. He is doing well with Na and fluid restriction and he'll continue this. Continue current doses of meds, check CMET with mag and phos today. Keep f/u with Dr. Rhea Belton in about 2 wks. F/u with me in 1 mo.  Intertrigo This has resolved.  Orthopnea Recent echo normal. This is resolving nicely as ascites has improved significantly.   An After  Visit Summary was printed and given to the patient.  FOLLOW UP: 1 mo

## 2012-08-26 ENCOUNTER — Telehealth: Payer: Self-pay | Admitting: *Deleted

## 2012-08-26 ENCOUNTER — Other Ambulatory Visit: Payer: Self-pay | Admitting: Family Medicine

## 2012-08-26 NOTE — Telephone Encounter (Signed)
Reassure him that products that he's talking about are fine.-thx

## 2012-08-26 NOTE — Telephone Encounter (Signed)
Pt had two questions when I called him with lab results: Pt was reading label on chewing gum and realized it contained "1% sugar alcohol".  Pt would like to know if he should avoid products that contain sugar alcohol.  Also, pt would like RX for Viagra. Please advise. [facility].

## 2012-08-26 NOTE — Telephone Encounter (Signed)
Returned call to patient.  Advised of below and also verbal from Dr. Milinda Cave that we should wait a couple of months to make sure things are stable before starting Viagra.  He is agreeable with this information.

## 2012-09-04 ENCOUNTER — Ambulatory Visit (INDEPENDENT_AMBULATORY_CARE_PROVIDER_SITE_OTHER): Payer: BC Managed Care – PPO | Admitting: Psychology

## 2012-09-04 ENCOUNTER — Encounter: Payer: Self-pay | Admitting: Internal Medicine

## 2012-09-04 DIAGNOSIS — F1021 Alcohol dependence, in remission: Secondary | ICD-10-CM

## 2012-09-10 ENCOUNTER — Encounter: Payer: Self-pay | Admitting: Internal Medicine

## 2012-09-10 ENCOUNTER — Other Ambulatory Visit (INDEPENDENT_AMBULATORY_CARE_PROVIDER_SITE_OTHER): Payer: BC Managed Care – PPO

## 2012-09-10 ENCOUNTER — Other Ambulatory Visit: Payer: Self-pay

## 2012-09-10 ENCOUNTER — Ambulatory Visit (INDEPENDENT_AMBULATORY_CARE_PROVIDER_SITE_OTHER): Payer: BC Managed Care – PPO | Admitting: Internal Medicine

## 2012-09-10 VITALS — BP 102/52 | HR 46 | Ht 65.0 in | Wt 163.0 lb

## 2012-09-10 DIAGNOSIS — K7460 Unspecified cirrhosis of liver: Secondary | ICD-10-CM

## 2012-09-10 LAB — COMPREHENSIVE METABOLIC PANEL
ALT: 16 U/L (ref 0–53)
AST: 30 U/L (ref 0–37)
BUN: 32 mg/dL — ABNORMAL HIGH (ref 6–23)
Calcium: 10.1 mg/dL (ref 8.4–10.5)
Chloride: 96 mEq/L (ref 96–112)
Creatinine, Ser: 2.1 mg/dL — ABNORMAL HIGH (ref 0.4–1.5)
Total Bilirubin: 1.5 mg/dL — ABNORMAL HIGH (ref 0.3–1.2)

## 2012-09-10 LAB — MAGNESIUM: Magnesium: 2 mg/dL (ref 1.5–2.5)

## 2012-09-10 LAB — PROTIME-INR
INR: 1.1 ratio — ABNORMAL HIGH (ref 0.8–1.0)
Prothrombin Time: 12.1 s (ref 10.2–12.4)

## 2012-09-10 LAB — CBC
HCT: 43.9 % (ref 39.0–52.0)
Hemoglobin: 15.1 g/dL (ref 13.0–17.0)
MCHC: 34.3 g/dL (ref 30.0–36.0)
MCV: 95.5 fl (ref 78.0–100.0)
Platelets: 262 10*3/uL (ref 150.0–400.0)
RDW: 13.9 % (ref 11.5–14.6)

## 2012-09-10 MED ORDER — SPIRONOLACTONE 100 MG PO TABS: 100.0000 mg | ORAL_TABLET | Freq: Every day | ORAL | Status: DC

## 2012-09-10 MED ORDER — FLUTICASONE PROPIONATE 50 MCG/ACT NA SUSP: 2.0000 | Freq: Every day | NASAL | Status: DC

## 2012-09-10 MED ORDER — FUROSEMIDE 40 MG PO TABS: 40.0000 mg | ORAL_TABLET | Freq: Every day | ORAL | Status: DC

## 2012-09-10 NOTE — Progress Notes (Signed)
Subjective:    Patient ID: Jared Garrett, male    DOB: May 31, 1951, 62 y.o.   MRN: [id number]  HPI Jared Garrett is a 62 yo male with a past medical history of alcohol abuse and a new diagnosis of cirrhosis with portal hypertension manifested by ascites, remote history of testicular cancer status post orchiectomy and lymph node dissection, hypertension, hyperlipidemia, degenerative disc disease, tobacco use who seen in followup. He was seen one month ago at that time his diuretics were increased. He reports a dramatic weight loss with near resolution of his lower extremity edema and significant decrease in his abdominal girth/ascites. He feels much better overall. He has remained completely alcohol abstinent and establish care with psychology. He reports liking his interaction with Dr. Dellia Cloud plans to continue this. He has noticed some recent nasal congestion but no fevers or chills. No sore throat. Mild cough overall improved with diuresis. He continues to follow a strict low-sodium diet. No abdominal pain. No nausea or vomiting.  Bowel movements have remained regular without blood in his stool or melena. He is weighing daily at home. He has noticed some itching on his abdomen and back  Weight at last office visit 219 pounds, today 163 pounds which represents a weight reduction of 56 pounds  Review of Systems As per history of present illness, otherwise negative  Current Medications, Allergies, Past Medical History, Past Surgical History, Family History and Social History were reviewed in [employer] record.     Objective:   Physical Exam BP 102/52  Pulse 46  Ht 5\' 5"  (1.651 m)  Wt 163 lb (73.936 kg)  BMI 27.12 kg/m2 Constitutional: Well-developed and well-nourished. No distress. HEENT: Normocephalic and atraumatic. Oropharynx is clear and moist. No scleral icterus. Cardiovascular: Normal rate, regular rhythm and intact distal pulses. No M/R/G Pulmonary/chest:  Somewhat distant breath sounds but clear laterally Abdominal: Soft, significantly less distended and nontender. Bowel sounds active throughout. Well-healed abdominal scar Extremities: no clubbing, cyanosis, or edema Neurological: Alert and oriented to person place and time. No asterixis Skin: Skin is warm and dry. No rashes noted. Psychiatric: Normal mood and affect. Behavior is normal.  CBC    Component Value Date/Time   WBC 10.1 08/13/2012 1150   RBC 3.55* 08/13/2012 1150   HGB 12.2* 08/13/2012 1150   HCT 36.0* 08/13/2012 1150   PLT 290.0 08/13/2012 1150   MCV 101.5* 08/13/2012 1150   MCH 35.5* 07/26/2012 1606   MCHC 33.9 08/13/2012 1150   RDW 14.0 08/13/2012 1150   LYMPHSABS 1.2 08/08/2012 1409   MONOABS 1.3* 08/08/2012 1409   EOSABS 0.0 08/08/2012 1409   BASOSABS 0.0 08/08/2012 1409    CMP     Component Value Date/Time   NA 136 08/23/2012 1359   K 4.5 08/23/2012 1359   CL 100 08/23/2012 1359   CO2 29 08/23/2012 1359   GLUCOSE 91 08/23/2012 1359   GLUCOSE 101* 05/28/2006 0830   BUN 13 08/23/2012 1359   CREATININE 0.9 08/23/2012 1359   CREATININE 0.67 07/26/2012 1606   CALCIUM 8.9 08/23/2012 1359   PROT 6.4 08/23/2012 1359   ALBUMIN 2.8* 08/23/2012 1359   AST 30 08/23/2012 1359   ALT 14 08/23/2012 1359   ALKPHOS 118* 08/23/2012 1359   BILITOT 1.7* 08/23/2012 1359   GFRNONAA >60 01/22/2009 0535   GFRAA  Value: >60        The eGFR has been calculated using the MDRD equation. This calculation has not been validated in all clinical  situations. eGFR's persistently <60 mL/min signify possible Chronic Kidney Disease. 01/22/2009 0535    INR 1.2  Repeat labs pending from today    Assessment & Plan:  62 year old male with a past medical history of alcohol abuse and a new diagnosis of cirrhosis with portal hypertension manifested by ascites, remote history of testicular cancer status post orchiectomy and lymph node dissection, hypertension, hyperlipidemia, degenerative disc disease, tobacco use who returns  for scheduled followup  1. Alcoholic cirrhosis with portal hypertension -- dramatic improvement in overall volume overload, and he has responded very well to diuretic doses. He has remained alcohol abstinent, which I have congratulated him on, and encouraged him to continue. He plans to continue meeting with Dr. Dellia Cloud, which I think will be very helpful for him.  Some element of his portal hypertension may have been acute and alcohol related, but he has underlying cirrhosis with some element of portal hypertension even despite alcohol abstinence.  At this point we will decrease his Lasix to 40 mg daily and Aldactone 100 mg daily. He has been advised to continue a low-sodium diet. He will weigh on a daily basis and if his weight increases by more than 5 pounds he is to notify my office. He voices understanding. We will schedule EGD for variceal screening. We will start hepatitis B vaccination series. He needs a Pneumovax, and I'll ask that he discuss this with Dr. Milinda Cave as our office does not have this vaccine  2. Health maintenance in cirrhosis --  Vaccines - Hep A -- immune  Hep B -- starting vaccine series today  Flu -- up-to-date Pneumovax - needs to be given, will ask PCP to dose  HCC screening - [place] Jan 2014 - up to date  Variceal screening - needs EGD, scheduled today  Already on beta blocker, heart rate is slightly low but blood pressure adequate without symptoms. We'll leave on current dose for now  3. CRC screening --he reports screening colonoscopy performed in approximately 2009, reportedly normal. We will try to obtain this report   4.  Itching -- no evidence for severe cholestasis, checking labs today. This may be secondary to dry skin and I have recommended topical moisturizing lotion daily for now.  5.  Post-nasal drip -- trial of Flonase daily. If no improvement he will discuss this with Dr. Milinda Cave  6.  Insomnia -- on hydroxyzine 25 mg each bedtime. This is helped some but not  completely. He will try  50 mg each bedtime dose  Return in 8 weeks

## 2012-09-10 NOTE — Patient Instructions (Addendum)
Your physician has requested that you go to the basement for the following lab work before leaving today:  See Dr. Milinda Cave for a Pneumococcal injection  Decrease Lasix to 40 mg  Decrease Aldactone to 100mg  daily Flonase has been sent to your pharmacy  Your next Hep B Injection is  10/11/2012  @ 9am  Follow up with dr. Rhea Belton in Office in 2 months  Put lotion on abdomin and back where itching

## 2012-09-11 ENCOUNTER — Telehealth: Payer: Self-pay | Admitting: *Deleted

## 2012-09-11 NOTE — Telephone Encounter (Signed)
Pt received the Email with Dr Lauro Franklin instructions d/t his lab results. Pt understands to hold Lasix and Aldactone, but he states Dr Rhea Belton placed him on Mag Ox 400mg  daily a couple of days/weeks ago; does he keep taking the Mag OX? Thanks.

## 2012-09-11 NOTE — Telephone Encounter (Signed)
Informed pt Dr Rhea Belton did not order Mag, but OK to continue it even though his last level was normal; pt stated understanding.

## 2012-09-11 NOTE — Telephone Encounter (Signed)
Mag ox was started by PCP.  Mag is normal on the supplement, so I would continue for now.

## 2012-09-13 ENCOUNTER — Other Ambulatory Visit (INDEPENDENT_AMBULATORY_CARE_PROVIDER_SITE_OTHER): Payer: BC Managed Care – PPO

## 2012-09-13 ENCOUNTER — Other Ambulatory Visit: Payer: Self-pay | Admitting: *Deleted

## 2012-09-13 DIAGNOSIS — K7460 Unspecified cirrhosis of liver: Secondary | ICD-10-CM

## 2012-09-13 LAB — BASIC METABOLIC PANEL
Calcium: 9.9 mg/dL (ref 8.4–10.5)
GFR: 45.3 mL/min — ABNORMAL LOW (ref >60.00)
Sodium: 129 mEq/L — ABNORMAL LOW (ref 135–145)

## 2012-09-16 ENCOUNTER — Other Ambulatory Visit: Payer: Self-pay | Admitting: *Deleted

## 2012-09-16 ENCOUNTER — Ambulatory Visit (INDEPENDENT_AMBULATORY_CARE_PROVIDER_SITE_OTHER): Payer: BC Managed Care – PPO | Admitting: Psychology

## 2012-09-16 DIAGNOSIS — F1011 Alcohol abuse, in remission: Secondary | ICD-10-CM

## 2012-09-17 ENCOUNTER — Other Ambulatory Visit (INDEPENDENT_AMBULATORY_CARE_PROVIDER_SITE_OTHER): Payer: BC Managed Care – PPO

## 2012-09-17 ENCOUNTER — Telehealth: Payer: Self-pay | Admitting: Gastroenterology

## 2012-09-17 LAB — BASIC METABOLIC PANEL
Chloride: 98 mEq/L (ref 96–112)
Creatinine, Ser: 1.3 mg/dL (ref 0.4–1.5)
Potassium: 6.1 mEq/L (ref 3.5–5.1)

## 2012-09-17 MED ORDER — SODIUM POLYSTYRENE SULFONATE 15 GM/60ML PO SUSP: ORAL | Status: DC

## 2012-09-17 NOTE — Telephone Encounter (Signed)
Critical lab  Potassium level is 6.1; lab did say it was slightly hemolyzed.   Would you like lab redrawn?

## 2012-09-17 NOTE — Telephone Encounter (Signed)
Informed pt his Kcl is high again and he need to take a drug to get rid of it thru his bowels. He will drink one dose tonight and another in the am. I will call him tomorrow to repeat the lab unless Dr Milinda Cave has other orders. Left this message on his home answering machine. Dr Milinda Cave , Dr Rhea Belton asked for your advice/input; do you have any other advise, we have been dealing with the hyperkalemia since 09/10/12? Thanks.

## 2012-09-17 NOTE — Telephone Encounter (Signed)
See result note.  

## 2012-09-18 ENCOUNTER — Other Ambulatory Visit (INDEPENDENT_AMBULATORY_CARE_PROVIDER_SITE_OTHER): Payer: BC Managed Care – PPO

## 2012-09-18 DIAGNOSIS — E875 Hyperkalemia: Secondary | ICD-10-CM

## 2012-09-18 LAB — BASIC METABOLIC PANEL
CO2: 25 mEq/L (ref 19–32)
GFR: 52.16 mL/min — ABNORMAL LOW (ref >60.00)
Glucose, Bld: 124 mg/dL — ABNORMAL HIGH (ref 70–99)
Potassium: 4 mEq/L (ref 3.5–5.1)
Sodium: 131 mEq/L — ABNORMAL LOW (ref 135–145)

## 2012-09-18 NOTE — Telephone Encounter (Signed)
Pt will see Dr Milinda Cave today.

## 2012-09-18 NOTE — Progress Notes (Signed)
Labs only

## 2012-09-25 ENCOUNTER — Telehealth: Payer: Self-pay | Admitting: Family Medicine

## 2012-09-25 ENCOUNTER — Ambulatory Visit: Payer: BC Managed Care – PPO | Admitting: Family Medicine

## 2012-09-25 NOTE — Telephone Encounter (Signed)
Most recent labs were 1 wk ago and they were good: potassium level was 4 --back into normal range. For constipation, if he is not already taking anything over the counter, I recommend he take generic senakot -S, 2 tabs every day.  If no result in 2-3 days, then I recommend adding an OTC med called miralax--1 capful 1-2 times per day as needed.-thx

## 2012-09-25 NOTE — Telephone Encounter (Signed)
Patient informed, understood & agreed; pt advised to continue to temp hold spironolactone & furosemide until f/u w/Dr. Rhea Belton on 04.07.14, unless his weight goes above 170 lbs, then he is to resume both medications, per Dr. Clyda Hurdle

## 2012-09-25 NOTE — Telephone Encounter (Signed)
Patient is requesting lab results. Pls call. Patient is also requesting advise or Rx for constipation.

## 2012-09-25 NOTE — Telephone Encounter (Signed)
Patient called back & said he is home now. Please call him at [phone]

## 2012-09-30 ENCOUNTER — Ambulatory Visit (INDEPENDENT_AMBULATORY_CARE_PROVIDER_SITE_OTHER): Payer: BC Managed Care – PPO | Admitting: Family Medicine

## 2012-09-30 ENCOUNTER — Encounter: Payer: Self-pay | Admitting: Family Medicine

## 2012-09-30 VITALS — BP 96/66 | HR 65 | Temp 98.4°F | Ht 65.0 in | Wt 167.0 lb

## 2012-09-30 DIAGNOSIS — E875 Hyperkalemia: Secondary | ICD-10-CM

## 2012-09-30 DIAGNOSIS — E871 Hypo-osmolality and hyponatremia: Secondary | ICD-10-CM

## 2012-09-30 DIAGNOSIS — Z23 Encounter for immunization: Secondary | ICD-10-CM

## 2012-09-30 LAB — BASIC METABOLIC PANEL
CO2: 22 mEq/L (ref 19–32)
Chloride: 101 mEq/L (ref 96–112)
Creatinine, Ser: 1 mg/dL (ref 0.4–1.5)
Sodium: 131 mEq/L — ABNORMAL LOW (ref 135–145)

## 2012-09-30 NOTE — Progress Notes (Signed)
OFFICE NOTE  09/30/2012  CC:  Chief Complaint  Patient presents with  . Follow-up    cirrhosis     HPI: Patient is a 62 y.o. Caucasian male who is here for here for f/u alc cirr  W/ portal htn f/u. Feeling better every day, home wt's stable, appetite good but he is appropriately limiting Na intake. He is not drinking any alcohol. Senakot S helped constipation.   Pertinent PMH:  Past Medical History  Diagnosis Date  . Alcoholism     Records show he was on this as early as 12/2007.  Marland Kitchen Hypertension   . Hyperlipidemia   . Degenerative disc disease, lumbar   . History of testicular cancer 1979    Testicle removed, extensive abd/pelvic lymph lymph node dissection  . Erectile dysfunction   . Tobacco dependence   . Peripheral edema     legs.  07/2012 bilateral venous/art dopplers via ortho were neg for DVT or Bustamante's cyst but interstitial fluid noted by ultrasonographer  . Chronic low back pain   . 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  . 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 hydrOXYzine (ATARAX/VISTARIL) 25 MG tablet 1 tab po qhs prn insomnia  30 tablet  1  . Multiple Vitamin (MULTIVITAMIN) tablet Take 1 tablet by mouth daily.      . propranolol (INDERAL) 40 MG tablet Take 1 tablet (40 mg total) by mouth 2 (two) times daily.  60 tablet  1  . fluticasone (FLONASE) 50 MCG/ACT nasal spray Place 2 sprays into the nose daily.  16 g  2  . furosemide (LASIX) 40 MG tablet Take 1 tablet (40 mg total) by mouth daily.  30 tablet  1  . sodium polystyrene (KAYEXALATE) 15 GM/60ML suspension Take 30 grams by mouth this evening and 30 grams by mouth tomorrow morning.  240 mL  1  . spironolactone (ALDACTONE) 100 MG tablet Take 1 tablet (100 mg total) by mouth daily.  30 tablet  3   No facility-administered  medications prior to visit.    PE: Blood pressure 96/66, pulse 65, temperature 98.4 F (36.9 C), temperature source Temporal, height 5\' 5"  (1.651 m), weight 167 lb (75.751 kg). Gen: Alert, well appearing.  Patient is oriented to person, place, time, and situation. No jaundice or pallor. CV: RRR, no m/r/g.   LUNGS: CTA bilat, nonlabored resps, good aeration in all lung fields. ABD: soft, NT, ND, BS normal.  No hepatospenomegaly or mass.  No bruits. EXT: 1+ bilat LE edema  IMPRESSION AND PLAN:  Alcoholic cirrhosis with portal HTN: doing very well. Current wt is fine, no need to restart diuretics at this time.  Continue propranolol 40mg  bid.  Check CMET today. F/u with GI/Dr. Rhea Belton soon (will be getting EGD).   Pneumovax today.  Alcoholism: doing well with abstinence.  Continue to see Dr. Redmond Baseman says this is helping.  Tobacco dependence: cutting back.  Encouraged to quit completely.  FOLLOW UP: 60mo

## 2012-10-01 ENCOUNTER — Encounter: Payer: BC Managed Care – PPO | Admitting: Internal Medicine

## 2012-10-01 ENCOUNTER — Ambulatory Visit (INDEPENDENT_AMBULATORY_CARE_PROVIDER_SITE_OTHER): Payer: BC Managed Care – PPO | Admitting: Psychology

## 2012-10-01 DIAGNOSIS — F1021 Alcohol dependence, in remission: Secondary | ICD-10-CM

## 2012-10-01 DIAGNOSIS — F1011 Alcohol abuse, in remission: Secondary | ICD-10-CM

## 2012-10-03 ENCOUNTER — Other Ambulatory Visit: Payer: Self-pay | Admitting: *Deleted

## 2012-10-03 MED ORDER — PROPRANOLOL HCL 40 MG PO TABS: 40.0000 mg | ORAL_TABLET | Freq: Two times a day (BID) | ORAL | Status: DC

## 2012-10-03 NOTE — Telephone Encounter (Signed)
Refill request for PROPRANOLOL Last filled- 08/03/12, #60 X 1  Last seen- 09/30/12 Follow up -2 MONTHS Refill sent per [facility] refill protocol.

## 2012-10-07 ENCOUNTER — Encounter: Payer: Self-pay | Admitting: Internal Medicine

## 2012-10-07 ENCOUNTER — Ambulatory Visit (AMBULATORY_SURGERY_CENTER): Payer: BC Managed Care – PPO | Admitting: Internal Medicine

## 2012-10-07 VITALS — BP 110/65 | HR 63 | Temp 96.7°F | Resp 19 | Ht 65.0 in | Wt 163.0 lb

## 2012-10-07 DIAGNOSIS — K7460 Unspecified cirrhosis of liver: Secondary | ICD-10-CM

## 2012-10-07 DIAGNOSIS — K766 Portal hypertension: Secondary | ICD-10-CM

## 2012-10-07 DIAGNOSIS — F1010 Alcohol abuse, uncomplicated: Secondary | ICD-10-CM

## 2012-10-07 DIAGNOSIS — K219 Gastro-esophageal reflux disease without esophagitis: Secondary | ICD-10-CM

## 2012-10-07 DIAGNOSIS — K2270 Barrett's esophagus without dysplasia: Secondary | ICD-10-CM

## 2012-10-07 HISTORY — PX: ESOPHAGOGASTRODUODENOSCOPY: SHX1529

## 2012-10-07 MED ORDER — SODIUM CHLORIDE 0.9 % IV SOLN: 500.0000 mL | INTRAVENOUS | Status: DC

## 2012-10-07 MED ORDER — PANTOPRAZOLE SODIUM 40 MG PO TBEC: 40.0000 mg | DELAYED_RELEASE_TABLET | Freq: Every day | ORAL | Status: DC

## 2012-10-07 NOTE — Progress Notes (Signed)
Patient did not experience any of the following events: a burn prior to discharge; a fall within the facility; wrong site/side/patient/procedure/implant event; or a hospital transfer or hospital admission upon discharge from the facility. (G8907) Patient did not have preoperative order for IV antibiotic SSI prophylaxis. (G8918)  

## 2012-10-07 NOTE — Op Note (Signed)
[facility] [address] [place], [address]   ENDOSCOPY PROCEDURE REPORT  PATIENT: Jared Garrett, Jared Garrett  MR#: [id number] BIRTHDATE: May 13, 1951 , 62  yrs. old GENDER: Male ENDOSCOPIST: Beverley Fiedler, MD REFERRED BY:  Earley Favor PROCEDURE DATE:  10/07/2012 PROCEDURE:  EGD w/ biopsy ASA CLASS:     Class III INDICATIONS:  Screening for varices.  History of portal HTN. MEDICATIONS: MAC sedation, administered by CRNA and propofol (Diprivan) 200mg  IV TOPICAL ANESTHETIC: Cetacaine Spray  DESCRIPTION OF PROCEDURE: After the risks benefits and alternatives of the procedure were thoroughly explained, informed consent was obtained.  The LB GIF-H180 [id number] endoscope was introduced through the mouth and advanced to the second portion of the duodenum. Without limitations.  The instrument was slowly withdrawn as the mucosa was fully examined.      ESOPHAGUS: There was evidence of suspected Barrett's esophagus 35-37 cm from the incisors.  Multiple biopsies were performed using cold forceps.  Sample sent for histology.   A 3 cm hiatal hernia was noted.   No esophageal varices seen.  STOMACH: Small nodule was found in the prepyloric region of the stomach.  Multiple biopsies were performed using cold forceps. Sample sent for histology.   The stomach otherwise appeared normal.   DUODENUM: The duodenal mucosa showed no abnormalities in the bulb and second portion of the duodenum.  Retroflexed views revealed a hiatal hernia.     The scope was then withdrawn from the patient and the procedure completed.  COMPLICATIONS: There were no complications.  ENDOSCOPIC IMPRESSION: 1.   There was evidence of suspected Barrett's esophagus (short segment) in the distal esophagus; multiple biopsies 2.   No esophageal varices 2.   3 cm hiatal hernia 3.   Nodule was found in the prepyloric region of the stomach; multiple biopsies 4.   The stomach otherwise appeared normal 5.    The duodenal mucosa showed no abnormalities in the bulb and second portion of the duodenum  RECOMMENDATIONS: 1.  Await pathology results 2.  Begin pantoprazole 40 mg once daily (best taken about 30 minutes to 1 hour before breakfast) for acid suppression (suspected Barrett's esophagus) 3.  Repeat EGD to be based on pathology results.  For variceal screening, repeat EGD in indicated in 3 years, however, EGD will likely occur sooner if diagnosis of Barrett's esophagus is made.  eSigned:  Beverley Fiedler, MD 10/07/2012 10:29 AM   CC:The Patient  PATIENT NAME:  Jared Garrett, Jared Garrett MR#: [id number]

## 2012-10-07 NOTE — Progress Notes (Signed)
Called to room to assist during endoscopic procedure.  Patient ID and intended procedure confirmed with present staff. Received instructions for my participation in the procedure from the performing physician.  

## 2012-10-07 NOTE — Patient Instructions (Addendum)

## 2012-10-08 ENCOUNTER — Telehealth: Payer: Self-pay

## 2012-10-08 NOTE — Telephone Encounter (Signed)
  Follow up Call-  Call back number 10/07/2012  Post procedure Call Back phone  # [phone] hm  Permission to leave phone message Yes     Patient questions:  Do you have a fever, pain , or abdominal swelling? no Pain Score  0 *  Have you tolerated food without any problems? yes  Have you been able to return to your normal activities? yes  Do you have any questions about your discharge instructions: Diet   no Medications  no Follow up visit  no  Do you have questions or concerns about your Care? no  Actions: * If pain score is 4 or above: No action needed, pain <4.

## 2012-10-11 ENCOUNTER — Ambulatory Visit (INDEPENDENT_AMBULATORY_CARE_PROVIDER_SITE_OTHER): Payer: BC Managed Care – PPO | Admitting: Internal Medicine

## 2012-10-11 DIAGNOSIS — Z23 Encounter for immunization: Secondary | ICD-10-CM

## 2012-10-15 ENCOUNTER — Ambulatory Visit (INDEPENDENT_AMBULATORY_CARE_PROVIDER_SITE_OTHER): Payer: BC Managed Care – PPO | Admitting: Psychology

## 2012-10-15 DIAGNOSIS — F1011 Alcohol abuse, in remission: Secondary | ICD-10-CM

## 2012-10-15 DIAGNOSIS — F1021 Alcohol dependence, in remission: Secondary | ICD-10-CM

## 2012-10-16 ENCOUNTER — Encounter: Payer: Self-pay | Admitting: Internal Medicine

## 2012-10-19 ENCOUNTER — Encounter: Payer: Self-pay | Admitting: Family Medicine

## 2012-10-25 ENCOUNTER — Telehealth: Payer: Self-pay | Admitting: Internal Medicine

## 2012-10-25 NOTE — Telephone Encounter (Signed)
Pt had questions about the path letter he received from [place]. He wants to know if the acid suppressing medication is pantoprazole and I explained yes. He wants to know which Dermatologist Dr Rhea Belton recommends. He wants to know if he has to remain on Mag Ox? Explained he was started on Mag Ox by Dr Marvel Plan on 08/26/12 d/t a low Mg level. Finally he wants to know if he can restart his Viagra? I will sent Dr Rhea Belton a staff msg, but Dr Milinda Cave, can pt stop the Mag Ox and can he restart Viagra? Thanks.

## 2012-10-28 NOTE — Telephone Encounter (Signed)
May stop mag ox. I'd recommend he not use viagra at this time due to recent history of borderline low bp.-thx

## 2012-10-29 NOTE — Telephone Encounter (Signed)
Informed pt he may stop the mag ox per Dr Milinda Cave and he recommends he not restart the Viagra d/t his BP being borderline low. Pt stated understanding.

## 2012-10-29 NOTE — Telephone Encounter (Signed)
lmom for pt to call back

## 2012-10-30 ENCOUNTER — Encounter: Payer: Self-pay | Admitting: Family Medicine

## 2012-10-30 ENCOUNTER — Telehealth: Payer: Self-pay | Admitting: Family Medicine

## 2012-10-30 ENCOUNTER — Ambulatory Visit (INDEPENDENT_AMBULATORY_CARE_PROVIDER_SITE_OTHER): Payer: BC Managed Care – PPO | Admitting: Family Medicine

## 2012-10-30 ENCOUNTER — Other Ambulatory Visit: Payer: Self-pay | Admitting: Family Medicine

## 2012-10-30 VITALS — BP 104/67 | HR 64 | Temp 98.1°F | Resp 16 | Wt 169.8 lb

## 2012-10-30 DIAGNOSIS — D2330 Other benign neoplasm of skin of unspecified part of face: Secondary | ICD-10-CM

## 2012-10-30 DIAGNOSIS — H01003 Unspecified blepharitis right eye, unspecified eyelid: Secondary | ICD-10-CM

## 2012-10-30 DIAGNOSIS — N529 Male erectile dysfunction, unspecified: Secondary | ICD-10-CM | POA: Insufficient documentation

## 2012-10-30 DIAGNOSIS — H01009 Unspecified blepharitis unspecified eye, unspecified eyelid: Secondary | ICD-10-CM

## 2012-10-30 DIAGNOSIS — H01006 Unspecified blepharitis left eye, unspecified eyelid: Secondary | ICD-10-CM

## 2012-10-30 DIAGNOSIS — K7030 Alcoholic cirrhosis of liver without ascites: Secondary | ICD-10-CM

## 2012-10-30 DIAGNOSIS — K7031 Alcoholic cirrhosis of liver with ascites: Secondary | ICD-10-CM

## 2012-10-30 HISTORY — DX: Unspecified blepharitis right eye, unspecified eyelid: H01.003

## 2012-10-30 MED ORDER — ERYTHROMYCIN 2 % EX OINT: TOPICAL_OINTMENT | CUTANEOUS | Status: DC

## 2012-10-30 MED ORDER — SILDENAFIL CITRATE 50 MG PO TABS: 50.0000 mg | ORAL_TABLET | Freq: Every day | ORAL | Status: DC | PRN

## 2012-10-30 NOTE — Progress Notes (Signed)
OFFICE NOTE  10/30/2012  CC:  Chief Complaint  Patient presents with  . Cyst    Pt reports cyst on Left facial cheek x2 mths w/o drainage  . Medication Management     HPI: Patient is a 62 y.o. Caucasian male who is here for 1 mo f/u alc cirrhosis. Says he feels well, energy increasing gradually, appetite good but still watching sodium and fluid intake.  He has been on propranolol but at my instruction he has been holding his diuretics for quite a while.  He continues to take the PPI he was rx'd after barretts esoph found by EGD recently.  Denies hx of any symptomatic heartburn.  C/o dry/irritated eyes, he thinks it has come on since getting on his diuretic meds and propran.  Worse lately since spring time pollen.  Says bp 110/75 at home, denies and lightheaded feeling upon going from supine or sitting into standing position.  He really wants to try viagra for his ED.  Has focal swelling on his left cheek noted the last couple of months, swelling but not painful.  No opening to the surface.  Has some redness and swelling of eyelids lately, itching as well.  Present before spring pollen increase but this has made it worse.  Uses some "dry eye" drops daily but finds this minimally helpful.  Pertinent PMH:  Past Medical History  Diagnosis Date  . Hypertension   . Hyperlipidemia   . Degenerative disc disease, lumbar   . Erectile dysfunction   . Tobacco dependence   . Peripheral edema     legs.  07/2012 bilateral venous/art dopplers via ortho were neg for DVT or Oler's cyst but interstitial fluid noted by ultrasonographer  . Chronic low back pain   . 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  . Alcoholic cirrhosis of liver with ascites 07/2012  . History of testicular cancer 1979    Testicle removed, extensive abd/pelvic lymph lymph node dissection  . Alcoholism     Records show he was on this as early  as 12/2007.  Marland Kitchen Allergy   . GERD (gastroesophageal reflux disease)     +Barretts esophagus on 10/07/12 EGD   Past Surgical History  Procedure Laterality Date  . Appendectomy    . Lumbar disc surgery  01/20/2009    Dr.'s Beane and Gioffre (L5/S1 hemilaminectomy)  . Transthoracic echocardiogram  08/21/12    Diastolic dysfunction, mild LAE, otherwise all normal.  . Tonsillectomy    . Esophagogastroduodenoscopy  10/07/12    No varices.  Barretts esophagus+ benign gastric nodule noted.  No dysplasia or malignancy.    MEDS:  Outpatient Prescriptions Prior to Visit  Medication Sig Dispense Refill  . hydrOXYzine (ATARAX/VISTARIL) 25 MG tablet 1 tab po qhs prn insomnia  30 tablet  1  . Multiple Vitamin (MULTIVITAMIN) tablet Take 1 tablet by mouth daily.      . pantoprazole (PROTONIX) 40 MG tablet Take 1 tablet (40 mg total) by mouth daily.  30 tablet  11  . propranolol (INDERAL) 40 MG tablet Take 1 tablet (40 mg total) by mouth 2 (two) times daily.  60 tablet  2  . fluticasone (FLONASE) 50 MCG/ACT nasal spray Place 2 sprays into the nose daily.  16 g  2  . furosemide (LASIX) 40 MG tablet Take 1 tablet (40 mg total) by mouth daily.  30 tablet  1  . magnesium  oxide (MAG-OX) 400 MG tablet Take 400 mg by mouth daily.      Marland Kitchen spironolactone (ALDACTONE) 100 MG tablet Take 1 tablet (100 mg total) by mouth daily.  30 tablet  3  . sodium polystyrene (KAYEXALATE) 15 GM/60ML suspension Take 30 grams by mouth this evening and 30 grams by mouth tomorrow morning.  240 mL  1   No facility-administered medications prior to visit.    PE: Blood pressure 104/67, pulse 64, temperature 98.1 F (36.7 C), temperature source Oral, resp. rate 16, weight 169 lb 12 oz (76.998 kg), SpO2 100.00%. Gen: Alert, well appearing.  Patient is oriented to person, place, time, and situation. AFFECT: pleasant, lucid thought and speech. Abd: soft, NT/ND. EXT: trace to 1+ pitting edema in pretial regions down into ankles bilat. FACE:  left cheek region with focal soft, nontender fluctuant nodular lesion in subQ tissue about 2-3 cm in diameter.  No erythema. EYES: lower lids with small erythematous swellings near base of lashes: 2-4 on each eye.  NO exudate.  No proptosis.  Bulbar conjunctiva is normal OU.  IMPRESSION AND PLAN:  1) Alc cirrhosis with portal HTN: edema minimal, wt stable off of diuretics at this time. Continue propranolol and continue OFF of diuretics at this time.  Barretts esoph, otherwise EGD ok (no varices).  Continue daily PPI.  2) Erectile dysfunction: I agreed to allow him back on trial of viagra.  Start at 1/2 of 50mg  tab to see how he feels, then may increase to 50mg  dose (which is the dose he says was effective in the past).  3) Cyst on left cheek: referral to derm for excision.  He has the name/number of an MD that Dr. Rhea Belton recommended, so I told him to proceed with this plan.  He'll call our office if any help needed with this referral.  4) Staph blepharitis: e-mycin ointment 1/2 inch ribbon qid x 10d.  FOLLOW UP: 2 mo

## 2012-10-30 NOTE — Telephone Encounter (Signed)
Patient contacted Dr. Annitta Jersey office & they cannot see him for a couple of months. Patient would like to have it removed earlier. Please advise.

## 2012-10-30 NOTE — Telephone Encounter (Signed)
I'll put in order for derm referral.

## 2012-10-31 ENCOUNTER — Encounter: Payer: Self-pay | Admitting: Family Medicine

## 2012-11-05 ENCOUNTER — Ambulatory Visit: Payer: BC Managed Care – PPO | Admitting: Psychology

## 2012-11-27 ENCOUNTER — Ambulatory Visit: Payer: BC Managed Care – PPO | Admitting: Psychology

## 2012-11-29 ENCOUNTER — Ambulatory Visit: Payer: BC Managed Care – PPO | Admitting: Family Medicine

## 2013-01-06 ENCOUNTER — Ambulatory Visit (INDEPENDENT_AMBULATORY_CARE_PROVIDER_SITE_OTHER): Payer: BC Managed Care – PPO | Admitting: Family Medicine

## 2013-01-06 ENCOUNTER — Encounter: Payer: Self-pay | Admitting: Family Medicine

## 2013-01-06 VITALS — BP 116/71 | HR 61 | Temp 98.2°F | Resp 16 | Ht 65.0 in | Wt 166.0 lb

## 2013-01-06 DIAGNOSIS — H01009 Unspecified blepharitis unspecified eye, unspecified eyelid: Secondary | ICD-10-CM

## 2013-01-06 DIAGNOSIS — N529 Male erectile dysfunction, unspecified: Secondary | ICD-10-CM

## 2013-01-06 DIAGNOSIS — H00019 Hordeolum externum unspecified eye, unspecified eyelid: Secondary | ICD-10-CM

## 2013-01-06 DIAGNOSIS — H01003 Unspecified blepharitis right eye, unspecified eyelid: Secondary | ICD-10-CM

## 2013-01-06 DIAGNOSIS — K7031 Alcoholic cirrhosis of liver with ascites: Secondary | ICD-10-CM

## 2013-01-06 DIAGNOSIS — H00016 Hordeolum externum left eye, unspecified eyelid: Secondary | ICD-10-CM

## 2013-01-06 DIAGNOSIS — K7030 Alcoholic cirrhosis of liver without ascites: Secondary | ICD-10-CM | POA: Insufficient documentation

## 2013-01-06 LAB — CBC WITH DIFFERENTIAL/PLATELET
Basophils Relative: 0.6 % (ref 0.0–3.0)
Eosinophils Relative: 2.2 % (ref 0.0–5.0)
HCT: 37.5 % — ABNORMAL LOW (ref 39.0–52.0)
Hemoglobin: 12.6 g/dL — ABNORMAL LOW (ref 13.0–17.0)
Lymphs Abs: 0.9 10*3/uL (ref 0.7–4.0)
Monocytes Relative: 11.2 % (ref 3.0–12.0)
Platelets: 125 10*3/uL — ABNORMAL LOW (ref 150.0–400.0)
RBC: 4.02 Mil/uL — ABNORMAL LOW (ref 4.22–5.81)
WBC: 6.1 10*3/uL (ref 4.5–10.5)

## 2013-01-06 LAB — COMPREHENSIVE METABOLIC PANEL
Alkaline Phosphatase: 78 U/L (ref 39–117)
BUN: 19 mg/dL (ref 6–23)
CO2: 26 mEq/L (ref 19–32)
Creatinine, Ser: 1 mg/dL (ref 0.4–1.5)
GFR: 81.59 mL/min (ref >60.00)
Glucose, Bld: 98 mg/dL (ref 70–99)
Sodium: 136 mEq/L (ref 135–145)
Total Bilirubin: 0.9 mg/dL (ref 0.3–1.2)
Total Protein: 6.3 g/dL (ref 6.0–8.3)

## 2013-01-06 MED ORDER — SILDENAFIL CITRATE 100 MG PO TABS: 100.0000 mg | ORAL_TABLET | Freq: Every day | ORAL | Status: DC | PRN

## 2013-01-06 MED ORDER — ERYTHROMYCIN 2 % EX OINT: TOPICAL_OINTMENT | CUTANEOUS | Status: DC

## 2013-01-06 NOTE — Assessment & Plan Note (Addendum)
With portal HTN but no ascites or signif peripheral edema. Doing excellent off diuretics. Continue excellent low Na diet and propranolol. He has done well abstaining from alcohol. Check CBC and CMET today.  Of note, he will be due for Hep B #3 around Sept/Oct 2014.  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

## 2013-01-06 NOTE — Progress Notes (Signed)
OFFICE NOTE  01/06/2013  CC:  Chief Complaint  Patient presents with  . Follow-up    2 month  . Eye Problem     HPI: Patient is a 62 y.o. Caucasian male who is here for 2 mo f/u alcoholic cirrhosis with portal HTN and erectile dysfunction. Feeling well, energy is good, eating low sodium diet, staying off his diuretics and not drinking alcohol.   Has a problem with his eye: chronic left eye stye waxes and wanes---occasional opens up and drains at the eye lash margin. Uses e-mycin ointment and didn't find much difference.  Uses OTC eye moisturizing drops and this helps some. He is in [employer] and is around dust a lot.  He tried low dose viagra and had no problems w/side effects but it did not help at all for erection.  He had good success, though, with 100 mg dose and he had no adverse effects.  He did get the cyst on his face excised by derm since I saw him last.  Pertinent PMH:  Past Medical History  Diagnosis Date  . Hypertension   . Hyperlipidemia   . Degenerative disc disease, lumbar   . Erectile dysfunction   . Tobacco dependence   . Peripheral edema     legs.  07/2012 bilateral venous/art dopplers via ortho were neg for DVT or Tener's cyst but interstitial fluid noted by ultrasonographer  . Chronic low back pain   . 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  . Alcoholic cirrhosis of liver with ascites 07/2012  . History of testicular cancer 1979    Testicle removed, extensive abd/pelvic lymph lymph node dissection  . Alcoholism     Records show he was on this as early as 12/2007:  he has been completely "dry" since 07/2012.  Marland Kitchen Perennial allergic rhinitis   . GERD (gastroesophageal reflux disease)     +Barretts esophagus on 10/07/12 EGD  . Asymptomatic varicose veins 12/25/2007   Past Surgical History  Procedure Laterality Date  . Appendectomy    . Lumbar disc surgery  01/20/2009    Dr.'s  Beane and Gioffre (L5/S1 hemilaminectomy)  . Transthoracic echocardiogram  08/21/12    Diastolic dysfunction, mild LAE, otherwise all normal.  . Tonsillectomy    . Esophagogastroduodenoscopy  10/07/12    No varices.  Barretts esophagus+ benign gastric nodule noted.  No dysplasia or malignancy.   Past family and social history reviewed and there are no changes since the patient's last office visit with me.  MEDS:  Outpatient Prescriptions Prior to Visit  Medication Sig Dispense Refill  . Multiple Vitamin (MULTIVITAMIN) tablet Take 1 tablet by mouth daily.      . pantoprazole (PROTONIX) 40 MG tablet Take 1 tablet (40 mg total) by mouth daily.  30 tablet  11  . propranolol (INDERAL) 40 MG tablet Take 1 tablet (40 mg total) by mouth 2 (two) times daily.  60 tablet  2  . spironolactone (ALDACTONE) 100 MG tablet Take 1 tablet (100 mg total) by mouth daily.  30 tablet  3  . Erythromycin 2 % ointment 1/2 inch ribbon in each eye qid x 10d  25 g  2  . sildenafil (VIAGRA) 50 MG tablet Take 1 tablet (50 mg total) by mouth daily as needed for erectile dysfunction.  10 tablet  3  . fluticasone (FLONASE) 50 MCG/ACT nasal spray Place 2 sprays into the nose  daily as needed.      . furosemide (LASIX) 40 MG tablet Take 1 tablet (40 mg total) by mouth daily.  30 tablet  1  . hydrOXYzine (ATARAX/VISTARIL) 25 MG tablet 1 tab po qhs prn insomnia  30 tablet  1   No facility-administered medications prior to visit.    PE: Blood pressure 116/71, pulse 61, temperature 98.2 F (36.8 C), temperature source Oral, resp. rate 16, height 5\' 5"  (1.651 m), weight 166 lb (75.297 kg), SpO2 99.00%. Gen: Alert, well appearing.  Patient is oriented to person, place, time, and situation. AFFECT: pleasant, lucid thought and speech. ENT:   Eyes: no icterus or exudate.  He has mild erythema at bilateral lid margins where the lashes are, some diffuse swelling of this.  He has a moderate sized hordeolum on left lower lid that is  erythematous but nontender. Smaller one on left upper lid w/out erythema.   EOMI, PERRLA. Nose: no drainage or turbinate edema/swelling.  No injection or focal lesion.  Mouth: lips without lesion/swelling.  Oral mucosa pink and moist. Oropharynx without erythema, exudate, or swelling.  Neck - No masses or thyromegaly or limitation in range of motion CV: RRR, no m/r/g.   LUNGS: CTA bilat, nonlabored resps, good aeration in all lung fields. ABD: soft, ND, NT. EXT: no clubbing or cyanosis.  He has trace to 1+ pitting edema in both pretibial regions down into feet, with some scattered non-inflamed varicosities.  LABS: none today  IMPRESSION AND PLAN:  Alcoholic cirrhosis of liver With portal HTN but no ascites or signif peripheral edema. Doing excellent off diuretics. Continue excellent low Na diet and propranolol. He has done well abstaining from alcohol. Check CBC and CMET today.  Of note, he will be due for Hep B #3 around Sept/Oct 2014.  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  Blepharitis of both eyes Discussed eyelid hygiene with J&J baby shampoo lid soaks bid chronically. Also, use e-mycin ointment rx'd in the past (renewed today) when he gets a significant hordeolum.  Erectile dysfunction Responded well to 100mg  viagra w/out side effect/hypotension. Rx'd 100mg  viagra for prn use today.   An After Visit Summary was printed and given to the patient.  FOLLOW UP: 78mo

## 2013-01-06 NOTE — Assessment & Plan Note (Signed)
Responded well to 100mg  viagra w/out side effect/hypotension. Rx'd 100mg  viagra for prn use today.

## 2013-01-06 NOTE — Assessment & Plan Note (Signed)
Discussed eyelid hygiene with J&J baby shampoo lid soaks bid chronically. Also, use e-mycin ointment rx'd in the past (renewed today) when he gets a significant hordeolum.

## 2013-01-14 ENCOUNTER — Telehealth: Payer: Self-pay | Admitting: Emergency Medicine

## 2013-01-14 ENCOUNTER — Other Ambulatory Visit: Payer: Self-pay | Admitting: Family Medicine

## 2013-01-14 MED ORDER — PROPRANOLOL HCL 40 MG PO TABS: 40.0000 mg | ORAL_TABLET | Freq: Two times a day (BID) | ORAL | Status: DC

## 2013-01-14 NOTE — Telephone Encounter (Signed)
Medication already filled.

## 2013-01-14 NOTE — Telephone Encounter (Signed)
Patient needs refill on Propranolol [facility] mg. [facility] [address] [facility] [address] [facility]

## 2013-03-13 ENCOUNTER — Ambulatory Visit (INDEPENDENT_AMBULATORY_CARE_PROVIDER_SITE_OTHER): Payer: BC Managed Care – PPO | Admitting: Internal Medicine

## 2013-03-13 DIAGNOSIS — Z23 Encounter for immunization: Secondary | ICD-10-CM

## 2013-05-08 ENCOUNTER — Other Ambulatory Visit: Payer: Self-pay

## 2013-05-13 ENCOUNTER — Other Ambulatory Visit: Payer: Self-pay | Admitting: Family Medicine

## 2013-05-13 ENCOUNTER — Ambulatory Visit (INDEPENDENT_AMBULATORY_CARE_PROVIDER_SITE_OTHER): Payer: BC Managed Care – PPO | Admitting: Family Medicine

## 2013-05-13 ENCOUNTER — Encounter: Payer: Self-pay | Admitting: Family Medicine

## 2013-05-13 VITALS — BP 115/71 | HR 58 | Temp 98.8°F | Resp 18 | Ht 65.0 in | Wt 168.0 lb

## 2013-05-13 DIAGNOSIS — K7030 Alcoholic cirrhosis of liver without ascites: Secondary | ICD-10-CM

## 2013-05-13 DIAGNOSIS — I10 Essential (primary) hypertension: Secondary | ICD-10-CM

## 2013-05-13 DIAGNOSIS — F17200 Nicotine dependence, unspecified, uncomplicated: Secondary | ICD-10-CM

## 2013-05-13 DIAGNOSIS — K7460 Unspecified cirrhosis of liver: Secondary | ICD-10-CM

## 2013-05-13 DIAGNOSIS — Z23 Encounter for immunization: Secondary | ICD-10-CM

## 2013-05-13 DIAGNOSIS — N529 Male erectile dysfunction, unspecified: Secondary | ICD-10-CM

## 2013-05-13 LAB — COMPREHENSIVE METABOLIC PANEL
ALT: 13 U/L (ref 0–53)
AST: 23 U/L (ref 0–37)
BUN: 19 mg/dL (ref 6–23)
CO2: 25 mEq/L (ref 19–32)
Calcium: 9 mg/dL (ref 8.4–10.5)
Creat: 0.95 mg/dL (ref 0.50–1.35)
Sodium: 138 mEq/L (ref 135–145)
Total Bilirubin: 0.7 mg/dL (ref 0.3–1.2)

## 2013-05-13 LAB — CBC WITH DIFFERENTIAL/PLATELET
Basophils Absolute: 0 10*3/uL (ref 0.0–0.1)
Basophils Relative: 1 % (ref 0–1)
Eosinophils Absolute: 0.2 10*3/uL (ref 0.0–0.7)
HCT: 37.9 % — ABNORMAL LOW (ref 39.0–52.0)
MCHC: 33.5 g/dL (ref 30.0–36.0)
Monocytes Absolute: 0.6 10*3/uL (ref 0.1–1.0)
Neutro Abs: 4.3 10*3/uL (ref 1.7–7.7)
Neutrophils Relative %: 70 % (ref 43–77)
Platelets: 147 10*3/uL — ABNORMAL LOW (ref 150–400)
RDW: 14.7 % (ref 11.5–15.5)

## 2013-05-13 LAB — PROTIME-INR: INR: 1.03 (ref <1.50)

## 2013-05-13 NOTE — Assessment & Plan Note (Signed)
The current medical regimen is effective;  continue present plan and medications.  

## 2013-05-13 NOTE — Progress Notes (Signed)
OFFICE NOTE  05/13/2013  CC:  Chief Complaint  Patient presents with  . Follow-up     HPI: Patient is a 62 y.o. Caucasian male who is here for 4 mo f/u alcoholic cirrhosis with portal HTN, tobacco dependence, and HTN. Still smoking about 1/2 pack/day, marlboro light usually.  He is working on cutting down but not thinking about total cessation at this time. Says he is doing great with abstinence from alcohol. Says he feels great, has been working hard flipping houses.  Compliant with meds: still holding diuretics and eating low Na diet.  Says bp at home are normal.  He checks it 1-2 times per week. He is doing light exercise regularly.  Still with ED problems, trying 50mg  viagra with no success.  100mg  dose helps somewhat but he wants to try cialis 5mg  daily to see if this allows for more spontaneity in his love life. Denies feeling any lightheadedness/presyncopal feelings with viagra use.   Pertinent PMH:  Past Medical History  Diagnosis Date  . Hypertension   . Low HDL (under 40)   . Degenerative disc disease, lumbar   . Erectile dysfunction   . Tobacco dependence   . Peripheral edema     legs.  07/2012 bilateral venous/art dopplers via ortho were neg for DVT or Mullenbach's cyst but interstitial fluid noted by ultrasonographer  . Chronic low back pain   . 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  . 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 History of testicular cancer 1979    Testicle removed, extensive abd/pelvic lymph lymph node dissection  . Alcoholism     Records show he was on this as early as 12/2007:  he has been completely "dry" since 07/2012.  Marland Kitchen Perennial allergic rhinitis   . GERD (gastroesophageal reflux disease)     +Barretts esophagus on 10/07/12 EGD  . Asymptomatic varicose veins 12/25/2007  . Blepharitis of both eyes 10/30/2012   Past  surgical, social, and family history reviewed and no changes noted since last office visit.  MEDS:  Outpatient Prescriptions Prior to Visit  Medication Sig Dispense Refill  . Erythromycin 2 % ointment 1/2 inch ribbon in each eye qid x 10d  25 g  2  . Multiple Vitamin (MULTIVITAMIN) tablet Take 1 tablet by mouth daily.      . pantoprazole (PROTONIX) 40 MG tablet Take 1 tablet (40 mg total) by mouth daily.  30 tablet  11  . propranolol (INDERAL) 40 MG tablet Take 1 tablet (40 mg total) by mouth 2 (two) times daily.  60 tablet  3  . sildenafil (VIAGRA) 100 MG tablet Take 1 tablet (100 mg total) by mouth daily as needed for erectile dysfunction.  10 tablet  6  . fluticasone (FLONASE) 50 MCG/ACT nasal spray Place 2 sprays into the nose daily as needed.      . furosemide (LASIX) 40 MG tablet Take 1 tablet (40 mg total) by mouth daily.  30 tablet  1  . hydrOXYzine (ATARAX/VISTARIL) 25 MG tablet 1 tab po qhs prn insomnia  30 tablet  1  . spironolactone (ALDACTONE) 100 MG tablet Take 1 tablet (100 mg total) by mouth daily.  30 tablet  3   No facility-administered medications prior to visit.    PE: Blood pressure 115/71, pulse 58, temperature 98.8 F (37.1 C), temperature  source Temporal, resp. rate 18, height 5\' 5"  (1.651 m), weight 168 lb (76.204 kg), SpO2 100.00%. Gen: Alert, well appearing.  Patient is oriented to person, place, time, and situation. ENT: Eyes: no injection, icteris, swelling, or exudate.  EOMI, PERRLA. Nose: no drainage or turbinate edema/swelling.  No injection or focal lesion.  Mouth: lips without lesion/swelling.  Oral mucosa pink and moist.    Oropharynx without erythema, exudate, or swelling.  Neck - No masses or thyromegaly or limitation in range of motion CV: RRR, no m/r/g.   LUNGS: CTA bilat, nonlabored resps, good aeration in all lung fields. ABD: soft, NT, ND, BS normal.  No hepatospenomegaly or mass.  No bruits. EXT: no clubbing, cyanosis, or edema.   LAB: none  today  Lab Results  Component Value Date   WBC 6.1 01/06/2013   HGB 12.6* 01/06/2013   HCT 37.5* 01/06/2013   MCV 93.2 01/06/2013   PLT 125.0* 01/06/2013     Chemistry      Component Value Date/Time   NA 136 01/06/2013 0856   K 5.0 01/06/2013 0856   CL 107 01/06/2013 0856   CO2 26 01/06/2013 0856   BUN 19 01/06/2013 0856   CREATININE 1.0 01/06/2013 0856   CREATININE 0.67 07/26/2012 1606      Component Value Date/Time   CALCIUM 9.4 01/06/2013 0856   ALKPHOS 78 01/06/2013 0856   AST 23 01/06/2013 0856   ALT 13 01/06/2013 0856   BILITOT 0.9 01/06/2013 0856      IMPRESSION AND PLAN:  Alcoholic cirrhosis of liver Doing excellent. Remain off diuretics. Continue all other current meds. Recheck CBC, CMET, and PT/INR today.  Erectile dysfunction Failed viagra. Start trial of cialis 5mg  qd (two sample packets of 30 tabs each were given to pt today--he'll call to request more samples or rx prn). Therapeutic expectations and side effect profile of medication discussed today.  Patient's questions answered.   TOBACCO ABUSE Encouraged complete cessation. He is cutting back but not ready to completely quit at this time.  HTN (hypertension), benign The current medical regimen is effective;  continue present plan and medications.    An After Visit Summary was printed and given to the patient.  FOLLOW UP: 6 mo

## 2013-05-13 NOTE — Assessment & Plan Note (Signed)
Encouraged complete cessation. He is cutting back but not ready to completely quit at this time.

## 2013-05-13 NOTE — Assessment & Plan Note (Signed)
Failed viagra. Start trial of cialis 5mg  qd (two sample packets of 30 tabs each were given to pt today--he'll call to request more samples or rx prn). Therapeutic expectations and side effect profile of medication discussed today.  Patient's questions answered.

## 2013-05-13 NOTE — Assessment & Plan Note (Signed)
Doing excellent. Remain off diuretics. Continue all other current meds. Recheck CBC, CMET, and PT/INR today.

## 2013-06-06 ENCOUNTER — Telehealth: Payer: Self-pay | Admitting: Family Medicine

## 2013-06-06 MED ORDER — PROPRANOLOL HCL 40 MG PO TABS: 40.0000 mg | ORAL_TABLET | Freq: Two times a day (BID) | ORAL | Status: DC

## 2013-06-06 NOTE — Telephone Encounter (Signed)
Patient needs a refill on propanolol sent [facility]

## 2013-07-15 ENCOUNTER — Telehealth: Payer: Self-pay | Admitting: Family Medicine

## 2013-07-15 NOTE — Telephone Encounter (Signed)
Patient is requesting Cialis sample. If no samples avail pls send Rx to [facility]. Patient also wanted to let Dr. Anitra Lauth know he feels great, the best he has felt in years.

## 2013-07-15 NOTE — Telephone Encounter (Signed)
I set aside a sample box of #30 of the cialis 5mg .  This is to be taken EVERY DAY, not just "as needed". If he finds this helpful then have him call and request a rx in about 55mo.-thx

## 2013-07-15 NOTE — Telephone Encounter (Signed)
Please advise.  I don't see cialis in his med list.

## 2013-07-15 NOTE — Telephone Encounter (Signed)
Patient aware and will pick up samples.

## 2013-08-14 ENCOUNTER — Telehealth: Payer: Self-pay | Admitting: Family Medicine

## 2013-08-14 NOTE — Telephone Encounter (Signed)
Called pt to let him know that 2, 30 day cialis sample packets are at front desk waiting for him. Left detailed message for pt on cell vm.

## 2013-08-14 NOTE — Telephone Encounter (Signed)
Patient received a sample earlier, is now out. Do we have another sample available? If not he mentioned that Dr. Anitra Lauth said he could send in an Rx.

## 2013-10-14 ENCOUNTER — Telehealth: Payer: Self-pay | Admitting: Family Medicine

## 2013-10-14 NOTE — Telephone Encounter (Signed)
Can pt pu samples of Cialis?

## 2013-10-14 NOTE — Telephone Encounter (Signed)
Sample at front desk for pt pick up. Patient aware.

## 2013-10-15 ENCOUNTER — Other Ambulatory Visit: Payer: Self-pay | Admitting: Family Medicine

## 2013-10-15 DIAGNOSIS — K219 Gastro-esophageal reflux disease without esophagitis: Secondary | ICD-10-CM

## 2013-10-15 MED ORDER — PANTOPRAZOLE SODIUM 40 MG PO TBEC: 40.0000 mg | DELAYED_RELEASE_TABLET | Freq: Every day | ORAL | Status: DC

## 2013-10-15 MED ORDER — PROPRANOLOL HCL 40 MG PO TABS: 40.0000 mg | ORAL_TABLET | Freq: Two times a day (BID) | ORAL | Status: DC

## 2013-11-11 ENCOUNTER — Ambulatory Visit: Payer: BC Managed Care – PPO | Admitting: Family Medicine

## 2013-11-20 ENCOUNTER — Ambulatory Visit (INDEPENDENT_AMBULATORY_CARE_PROVIDER_SITE_OTHER): Payer: BC Managed Care – PPO | Admitting: Family Medicine

## 2013-11-20 ENCOUNTER — Encounter: Payer: Self-pay | Admitting: Family Medicine

## 2013-11-20 VITALS — BP 123/72 | HR 65 | Temp 98.0°F | Resp 18 | Ht 65.0 in | Wt 166.0 lb

## 2013-11-20 DIAGNOSIS — K219 Gastro-esophageal reflux disease without esophagitis: Secondary | ICD-10-CM

## 2013-11-20 DIAGNOSIS — K7031 Alcoholic cirrhosis of liver with ascites: Secondary | ICD-10-CM

## 2013-11-20 DIAGNOSIS — E785 Hyperlipidemia, unspecified: Secondary | ICD-10-CM

## 2013-11-20 DIAGNOSIS — K7030 Alcoholic cirrhosis of liver without ascites: Secondary | ICD-10-CM

## 2013-11-20 LAB — LIPID PANEL
CHOL/HDL RATIO: 5
Cholesterol: 131 mg/dL (ref 0–200)
HDL: 27.5 mg/dL — ABNORMAL LOW (ref >39.00)
LDL Cholesterol: 90 mg/dL (ref 0–99)
Triglycerides: 70 mg/dL (ref 0.0–149.0)
VLDL: 14 mg/dL (ref 0.0–40.0)

## 2013-11-20 LAB — CBC WITH DIFFERENTIAL/PLATELET
BASOS ABS: 0 10*3/uL (ref 0.0–0.1)
Basophils Relative: 0.7 % (ref 0.0–3.0)
EOS ABS: 0.2 10*3/uL (ref 0.0–0.7)
Eosinophils Relative: 2.9 % (ref 0.0–5.0)
HCT: 37 % — ABNORMAL LOW (ref 39.0–52.0)
Hemoglobin: 12.4 g/dL — ABNORMAL LOW (ref 13.0–17.0)
LYMPHS PCT: 14 % (ref 12.0–46.0)
Lymphs Abs: 0.9 10*3/uL (ref 0.7–4.0)
MCHC: 33.4 g/dL (ref 30.0–36.0)
MCV: 89 fl (ref 78.0–100.0)
Monocytes Absolute: 0.8 10*3/uL (ref 0.1–1.0)
Monocytes Relative: 12.6 % — ABNORMAL HIGH (ref 3.0–12.0)
Neutro Abs: 4.6 10*3/uL (ref 1.4–7.7)
Neutrophils Relative %: 69.8 % (ref 43.0–77.0)
Platelets: 164 10*3/uL (ref 150.0–400.0)
RBC: 4.16 Mil/uL — ABNORMAL LOW (ref 4.22–5.81)
RDW: 15.9 % — AB (ref 11.5–15.5)
WBC: 6.6 10*3/uL (ref 4.0–10.5)

## 2013-11-20 LAB — COMPREHENSIVE METABOLIC PANEL
ALBUMIN: 3.6 g/dL (ref 3.5–5.2)
ALT: 13 U/L (ref 0–53)
AST: 24 U/L (ref 0–37)
Alkaline Phosphatase: 72 U/L (ref 39–117)
BUN: 13 mg/dL (ref 6–23)
CALCIUM: 8.9 mg/dL (ref 8.4–10.5)
CHLORIDE: 107 meq/L (ref 96–112)
CO2: 25 mEq/L (ref 19–32)
Creatinine, Ser: 1 mg/dL (ref 0.4–1.5)
GFR: 82.31 mL/min (ref >60.00)
Glucose, Bld: 70 mg/dL (ref 70–99)
POTASSIUM: 5 meq/L (ref 3.5–5.1)
SODIUM: 138 meq/L (ref 135–145)
TOTAL PROTEIN: 6.2 g/dL (ref 6.0–8.3)
Total Bilirubin: 0.6 mg/dL (ref 0.2–1.2)

## 2013-11-20 MED ORDER — PROPRANOLOL HCL 40 MG PO TABS: 40.0000 mg | ORAL_TABLET | Freq: Two times a day (BID) | ORAL | Status: AC

## 2013-11-20 MED ORDER — PANTOPRAZOLE SODIUM 40 MG PO TBEC: 40.0000 mg | DELAYED_RELEASE_TABLET | Freq: Every day | ORAL | Status: AC

## 2013-11-20 MED ORDER — ZOSTER VACCINE LIVE 19400 UNT/0.65ML ~~LOC~~ SOLR: 0.6500 mL | Freq: Once | SUBCUTANEOUS | Status: DC

## 2013-11-20 MED ORDER — TADALAFIL 5 MG PO TABS: ORAL_TABLET | ORAL | Status: DC

## 2013-11-20 NOTE — Progress Notes (Signed)
Pre visit review using our clinic review tool, if applicable. No additional management support is needed unless otherwise documented below in the visit note. 

## 2013-11-20 NOTE — Progress Notes (Signed)
OFFICE NOTE  11/20/2013  CC:  Chief Complaint  Patient presents with  . Follow-up     HPI: Patient is a 63 y.o. Caucasian male who is here for 6 mo f/u alcoholic cirrhosis with portal HTN, tob dep, HTN.   Says he's doing well.  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  Home bps normal. Taking propranolol and pantoprazole as prescribed. Still smoking about 1/2 pack/day cigs. No alcohol.  Eating low Na diet.  Pertinent PMH:  Past surgical, social, and family history reviewed and no changes noted since last office visit.  MEDS: Not taking aldactone or furosemide listed below. He is taking cialis 5mg  qd not listed below. Outpatient Prescriptions Prior to Visit  Medication Sig Dispense Refill  . Multiple Vitamin (MULTIVITAMIN) tablet Take 1 tablet by mouth daily.      . pantoprazole (PROTONIX) 40 MG tablet Take 1 tablet (40 mg total) by mouth daily.  30 tablet  3  . propranolol (INDERAL) 40 MG tablet Take 1 tablet (40 mg total) by mouth 2 (two) times daily.  60 tablet  3  . Erythromycin 2 % ointment 1/2 inch ribbon in each eye qid x 10d  25 g  2  . fluticasone (FLONASE) 50 MCG/ACT nasal spray Place 2 sprays into the nose daily as needed.      . furosemide (LASIX) 40 MG tablet Take 1 tablet (40 mg total) by mouth daily.  30 tablet  1  . hydrOXYzine (ATARAX/VISTARIL) 25 MG tablet 1 tab po qhs prn insomnia  30 tablet  1  . spironolactone (ALDACTONE) 100 MG tablet Take 1 tablet (100 mg total) by mouth daily.  30 tablet  3   No facility-administered medications prior to visit.    PE: Blood pressure 123/72, pulse 65, temperature 98 F (36.7 C), temperature source Temporal, resp. rate 18, height 5\' 5"  (1.651 m), weight 166 lb (75.297 kg), SpO2 97.00%. Gen: Alert, well appearing.  Patient is oriented to person, place, time, and situation. NUU:VOZD: no injection, icteris, swelling, or exudate.  EOMI, PERRLA. Mouth: lips without lesion/swelling.  Oral  mucosa pink and moist. Oropharynx without erythema, exudate, or swelling.  CV: RRR, no m/r/g.   LUNGS: CTA bilat, nonlabored resps, good aeration in all lung fields. ABD: soft, NT, ND, BS normal.  No hepatospenomegaly or mass.  No bruits. EXT: no clubbing, cyanosis, or edema.    IMPRESSION AND PLAN:  1) Cirrhosis with hx of portal HTN: doing excellent off of diuretics. Continue propranolol, low Na diet, abstinence. Check CBC, CMET today.  FLP today.  2) HTN: The current medical regimen is effective;  continue present plan and medications. Low Na diet is doing great.  3) Tob dependence: ongoing, still working on cutting back.  Encouraged complete cessation but pt not contemplating this right now.  4) preventative health care: zostavax rx printed and given to patient today.  An After Visit Summary was printed and given to the patient.   FOLLOW UP: 6 mo

## 2013-11-21 ENCOUNTER — Telehealth: Payer: Self-pay | Admitting: Family Medicine

## 2013-11-21 NOTE — Telephone Encounter (Signed)
Relevant patient education mailed to patient.  

## 2013-11-28 ENCOUNTER — Encounter: Payer: Self-pay | Admitting: Internal Medicine

## 2014-02-02 ENCOUNTER — Encounter: Payer: Self-pay | Admitting: Family Medicine

## 2014-02-02 ENCOUNTER — Ambulatory Visit (INDEPENDENT_AMBULATORY_CARE_PROVIDER_SITE_OTHER): Payer: BC Managed Care – PPO | Admitting: Family Medicine

## 2014-02-02 ENCOUNTER — Ambulatory Visit (HOSPITAL_BASED_OUTPATIENT_CLINIC_OR_DEPARTMENT_OTHER)
Admission: RE | Admit: 2014-02-02 | Discharge: 2014-02-02 | Disposition: A | Discharge status: Home / Self Care | Payer: BC Managed Care – PPO | Source: Ambulatory Visit | Attending: Family Medicine | Admitting: Family Medicine

## 2014-02-02 ENCOUNTER — Encounter (HOSPITAL_BASED_OUTPATIENT_CLINIC_OR_DEPARTMENT_OTHER): Payer: Self-pay

## 2014-02-02 VITALS — BP 127/77 | HR 58 | Temp 99.0°F | Resp 16 | Ht 65.0 in | Wt 163.0 lb

## 2014-02-02 DIAGNOSIS — R221 Localized swelling, mass and lump, neck: Secondary | ICD-10-CM

## 2014-02-02 DIAGNOSIS — R220 Localized swelling, mass and lump, head: Secondary | ICD-10-CM | POA: Insufficient documentation

## 2014-02-02 LAB — CBC WITH DIFFERENTIAL/PLATELET
Basophils Absolute: 0.1 10*3/uL (ref 0.0–0.1)
Basophils Relative: 0.9 % (ref 0.0–3.0)
Eosinophils Absolute: 0.3 10*3/uL (ref 0.0–0.7)
Eosinophils Relative: 3.2 % (ref 0.0–5.0)
HCT: 39.8 % (ref 39.0–52.0)
Hemoglobin: 13.2 g/dL (ref 13.0–17.0)
Lymphocytes Relative: 12.5 % (ref 12.0–46.0)
Lymphs Abs: 1.1 10*3/uL (ref 0.7–4.0)
MCHC: 33.1 g/dL (ref 30.0–36.0)
MCV: 89.2 fl (ref 78.0–100.0)
MONO ABS: 0.7 10*3/uL (ref 0.1–1.0)
Monocytes Relative: 8.8 % (ref 3.0–12.0)
NEUTROS PCT: 74.6 % (ref 43.0–77.0)
Neutro Abs: 6.3 10*3/uL (ref 1.4–7.7)
Platelets: 184 10*3/uL (ref 150.0–400.0)
RBC: 4.46 Mil/uL (ref 4.22–5.81)
RDW: 16.8 % — ABNORMAL HIGH (ref 11.5–15.5)
WBC: 8.5 10*3/uL (ref 4.0–10.5)

## 2014-02-02 LAB — COMPREHENSIVE METABOLIC PANEL
ALBUMIN: 3.7 g/dL (ref 3.5–5.2)
ALK PHOS: 77 U/L (ref 39–117)
ALT: 12 U/L (ref 0–53)
AST: 26 U/L (ref 0–37)
BUN: 10 mg/dL (ref 6–23)
CO2: 26 mEq/L (ref 19–32)
Calcium: 9 mg/dL (ref 8.4–10.5)
Chloride: 102 mEq/L (ref 96–112)
Creatinine, Ser: 0.9 mg/dL (ref 0.4–1.5)
GFR: 94.37 mL/min (ref >60.00)
GLUCOSE: 105 mg/dL — AB (ref 70–99)
POTASSIUM: 4.4 meq/L (ref 3.5–5.1)
Sodium: 135 mEq/L (ref 135–145)
Total Bilirubin: 1.2 mg/dL (ref 0.2–1.2)
Total Protein: 6.2 g/dL (ref 6.0–8.3)

## 2014-02-02 LAB — SEDIMENTATION RATE: SED RATE: 9 mm/h (ref 0–22)

## 2014-02-02 LAB — C-REACTIVE PROTEIN: CRP: <0.5 mg/dL (ref 0.5–20.0)

## 2014-02-02 MED ORDER — IOHEXOL 300 MG/ML  SOLN
100.0000 mL | Freq: Once | INTRAMUSCULAR | Status: AC | PRN
  Administered 2014-02-02: 100 mL via INTRAVENOUS

## 2014-02-02 NOTE — Progress Notes (Addendum)
OFFICE NOTE  02/02/2014  CC:  Chief Complaint  Patient presents with  . Mass    on Right side of neck    HPI: Patient is a 63 y.o. white male who is here for mass on right side of neck. Onset about 2 mo ago, " I noticed it b/c I felt like I got a bug bite there".  It felt swollen and then gradually regressed back to normal.  Then about a month ago it came back up again.  It feels somewhat tender and makes some neck movements uncomfortable.  No malaise, no fevers.  Appetite is good, energy level good. No cough.  No exposure to cats.  Has no known hx of pos TB test.  Pertinent PMH:  Past medical, surgical, social, and family history reviewed and no changes are noted since last office visit.  MEDS:  Outpatient Prescriptions Prior to Visit  Medication Sig Dispense Refill  . Multiple Vitamin (MULTIVITAMIN) tablet Take 1 tablet by mouth daily.      . pantoprazole (PROTONIX) 40 MG tablet Take 1 tablet (40 mg total) by mouth daily.  90 tablet  3  . propranolol (INDERAL) 40 MG tablet Take 1 tablet (40 mg total) by mouth 2 (two) times daily.  180 tablet  3  . fluticasone (FLONASE) 50 MCG/ACT nasal spray Place 2 sprays into the nose daily as needed.      . furosemide (LASIX) 40 MG tablet Take 1 tablet (40 mg total) by mouth daily.  30 tablet  1  . hydrOXYzine (ATARAX/VISTARIL) 25 MG tablet 1 tab po qhs prn insomnia  30 tablet  1  . spironolactone (ALDACTONE) 100 MG tablet Take 1 tablet (100 mg total) by mouth daily.  30 tablet  3  . tadalafil (CIALIS) 5 MG tablet 1 tab po qd for erectile dysfunction  90 tablet  4  . zoster vaccine live, PF, (ZOSTAVAX) 86578 UNT/0.65ML injection Inject 19,400 Units into the skin once.  1 vial  0  . Erythromycin 2 % ointment 1/2 inch ribbon in each eye qid x 10d  25 g  2   No facility-administered medications prior to visit.    PE: Blood pressure 127/77, pulse 58, temperature 99 F (37.2 C), temperature source Temporal, resp. rate 16, height 5' 5"  (1.651 m),  weight 163 lb (73.936 kg), SpO2 99.00%. Gen: Alert, well appearing.  Patient is oriented to person, place, time, and situation. ION:GEXB: no injection, icteris, swelling, or exudate.  EOMI, PERRLA. Mouth: lips without lesion/swelling.  Oral mucosa pink and moist. Tongue with small amount of white film, same appearance to uvula.  Oropharynx without erythema, exudate, or swelling.  Scalp with many seb keratoses, o/w normal. NECK: Over the right SCM muscle and just inferior to the angle of right mandible is a round, large (8-9cm diameter), firm and mildly tender mass.  Not moveable.  No overlying skin changes.  No mass anywhere else on neck and supraclavicular regions w/out mass.  IMPRESSION AND PLAN:  Right lateral neck mass. Check neck CT w/contrast today. TB skin test placed. Check CBC, CMET, ESR/CRP, and bartonella antibody panel.  An After Visit Summary was printed and given to the patient.  FOLLOW UP: To be determined based on results of pending workup.

## 2014-02-02 NOTE — Progress Notes (Signed)
Pre visit review using our clinic review tool, if applicable. No additional management support is needed unless otherwise documented below in the visit note. 

## 2014-02-03 ENCOUNTER — Other Ambulatory Visit: Payer: Self-pay | Admitting: Family Medicine

## 2014-02-03 DIAGNOSIS — C770 Secondary and unspecified malignant neoplasm of lymph nodes of head, face and neck: Secondary | ICD-10-CM

## 2014-02-03 DIAGNOSIS — C01 Malignant neoplasm of base of tongue: Secondary | ICD-10-CM

## 2014-02-03 DIAGNOSIS — J358 Other chronic diseases of tonsils and adenoids: Secondary | ICD-10-CM

## 2014-02-04 ENCOUNTER — Ambulatory Visit: Payer: BC Managed Care – PPO | Admitting: Family Medicine

## 2014-02-04 LAB — TB SKIN TEST
INDURATION: 0 mm
TB Skin Test: NEGATIVE

## 2014-02-05 LAB — BARTONELLA ANTIBODY PANEL: B henselae IgM: NEGATIVE

## 2014-02-05 LAB — BARTONELLA ANITBODY PANEL: B HENSELAE IGG: NEGATIVE

## 2014-02-06 ENCOUNTER — Other Ambulatory Visit: Payer: Self-pay | Admitting: Otolaryngology

## 2014-02-06 ENCOUNTER — Other Ambulatory Visit (HOSPITAL_COMMUNITY)
Admission: RE | Admit: 2014-02-06 | Discharge: 2014-02-06 | Disposition: A | Discharge status: Home / Self Care | Payer: BC Managed Care – PPO | Source: Ambulatory Visit | Attending: Otolaryngology | Admitting: Otolaryngology

## 2014-02-06 DIAGNOSIS — C801 Malignant (primary) neoplasm, unspecified: Secondary | ICD-10-CM

## 2014-02-06 DIAGNOSIS — Z87891 Personal history of nicotine dependence: Secondary | ICD-10-CM | POA: Insufficient documentation

## 2014-02-06 DIAGNOSIS — C760 Malignant neoplasm of head, face and neck: Secondary | ICD-10-CM | POA: Insufficient documentation

## 2014-02-06 HISTORY — DX: Malignant (primary) neoplasm, unspecified: C80.1

## 2014-02-13 ENCOUNTER — Other Ambulatory Visit (HOSPITAL_COMMUNITY): Payer: Self-pay | Admitting: Otolaryngology

## 2014-02-13 DIAGNOSIS — C779 Secondary and unspecified malignant neoplasm of lymph node, unspecified: Secondary | ICD-10-CM

## 2014-02-17 ENCOUNTER — Telehealth: Payer: Self-pay | Admitting: Hematology and Oncology

## 2014-02-17 ENCOUNTER — Other Ambulatory Visit: Payer: Self-pay | Admitting: *Deleted

## 2014-02-17 ENCOUNTER — Encounter: Payer: Self-pay | Admitting: Vascular Surgery

## 2014-02-17 DIAGNOSIS — I6529 Occlusion and stenosis of unspecified carotid artery: Secondary | ICD-10-CM

## 2014-02-17 NOTE — Telephone Encounter (Signed)
C/D 02/17/14 for appt. 02/18/14

## 2014-02-17 NOTE — Telephone Encounter (Signed)
S/W PT IN REF TO NP APPT ON 02/18/14@2 :00 REFERRING DR Erik Obey DX-MALIGNANT CELLS CONSISTENT WITH SQUAMOUS CELL CARCINOMA

## 2014-02-18 ENCOUNTER — Encounter: Payer: Self-pay | Admitting: Vascular Surgery

## 2014-02-18 ENCOUNTER — Encounter: Payer: Self-pay | Admitting: Hematology and Oncology

## 2014-02-18 ENCOUNTER — Other Ambulatory Visit: Payer: Self-pay | Admitting: Hematology and Oncology

## 2014-02-18 ENCOUNTER — Ambulatory Visit: Payer: BC Managed Care – PPO

## 2014-02-18 ENCOUNTER — Ambulatory Visit (HOSPITAL_BASED_OUTPATIENT_CLINIC_OR_DEPARTMENT_OTHER): Payer: BC Managed Care – PPO | Admitting: Hematology and Oncology

## 2014-02-18 VITALS — BP 125/71 | HR 58 | Temp 98.4°F | Resp 18 | Ht 65.0 in | Wt 175.4 lb

## 2014-02-18 DIAGNOSIS — C779 Secondary and unspecified malignant neoplasm of lymph node, unspecified: Secondary | ICD-10-CM

## 2014-02-18 DIAGNOSIS — H9190 Unspecified hearing loss, unspecified ear: Secondary | ICD-10-CM | POA: Insufficient documentation

## 2014-02-18 DIAGNOSIS — Z8547 Personal history of malignant neoplasm of testis: Secondary | ICD-10-CM

## 2014-02-18 DIAGNOSIS — H9193 Unspecified hearing loss, bilateral: Secondary | ICD-10-CM

## 2014-02-18 DIAGNOSIS — L989 Disorder of the skin and subcutaneous tissue, unspecified: Secondary | ICD-10-CM

## 2014-02-18 DIAGNOSIS — C801 Malignant (primary) neoplasm, unspecified: Secondary | ICD-10-CM

## 2014-02-18 DIAGNOSIS — M542 Cervicalgia: Secondary | ICD-10-CM

## 2014-02-18 DIAGNOSIS — C760 Malignant neoplasm of head, face and neck: Secondary | ICD-10-CM

## 2014-02-18 DIAGNOSIS — IMO0002 Reserved for concepts with insufficient information to code with codable children: Secondary | ICD-10-CM | POA: Insufficient documentation

## 2014-02-18 DIAGNOSIS — R918 Other nonspecific abnormal finding of lung field: Secondary | ICD-10-CM | POA: Insufficient documentation

## 2014-02-18 DIAGNOSIS — R911 Solitary pulmonary nodule: Secondary | ICD-10-CM

## 2014-02-18 HISTORY — DX: Cervicalgia: M54.2

## 2014-02-18 MED ORDER — HYDROCODONE-ACETAMINOPHEN 5-325 MG PO TABS: 1.0000 | ORAL_TABLET | Freq: Four times a day (QID) | ORAL | Status: DC | PRN

## 2014-02-18 NOTE — Assessment & Plan Note (Signed)
He has numerous skin lesions. I recommend dermatology review in the future.

## 2014-02-18 NOTE — Progress Notes (Signed)
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 male is here because of recent diagnosis of squamous cell carcinoma. He has been complaining of a recurrence of swelling of the lymph node on the right side of his neck. This is noticeable since June of this year. He went to his primary care provider in July and CT scan show abnormal lymphadenopathy. 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 The patient had a history of significant alcoholism with Barrett's esophagus, portal hypertension and ascites. These had improved since he is abstinent from alcohol 2 years ago. The patient had a remote history of right testicular cancer, resected in 1979. He never received adjuvant chemotherapy or radiation treatment. He had numerous skin lesions on his scalp but never saw a dermatologist. 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 He denies any sore throat. He has some discomfort on his right neck. Denies any changes in the quality of his voice. Denies any dysphagia. He denies recent weight loss. MEDICAL HISTORY:  Past Medical History  Diagnosis Date  . Hypertension   . Low HDL (under 40)   . Degenerative disc disease, lumbar   . Erectile dysfunction   . Tobacco dependence   . Peripheral edema     legs.  07/2012 bilateral venous/art dopplers via ortho were neg for DVT or Dible's cyst but interstitial fluid noted by ultrasonographer  . Chronic low back pain   . 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  . 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 History of testicular cancer 1979    Testicle removed, extensive abd/pelvic lymph lymph node dissection  . Alcoholism     Records show he was on this as early as 12/2007:  he has been completely "dry" since 07/2012.  Marland Kitchen Perennial allergic rhinitis   . GERD (gastroesophageal reflux disease)     +Barretts esophagus on 10/07/12 EGD  . Asymptomatic varicose veins 12/25/2007  . Blepharitis of both eyes 10/30/2012  . Neck pain 02/18/2014    SURGICAL HISTORY: Past Surgical History  Procedure Laterality Date  . Appendectomy    . Lumbar disc surgery  01/20/2009    Dr.'s Beane and Gioffre (L5/S1 hemilaminectomy)  . Transthoracic echocardiogram  0/17/79    Diastolic dysfunction, mild LAE, otherwise all normal.  . Tonsillectomy    . Esophagogastroduodenoscopy  10/07/12    No varices.  Barretts esophagus+ benign gastric nodule noted.  No dysplasia or malignancy.  . Colonoscopy  2013    per pt report-normal  . Testicular removal    . Lymph node dissection      SOCIAL HISTORY: History   Social History  . Marital Status: Married    Spouse Name: N/A    Number of Children: 1  . Years of Education: N/A   Occupational History  . [employer] and proper ties    Social History Main Topics  .  Smoking status: Former Smoker -- 1.00 packs/day for 20 years    Types: Cigarettes  . Smokeless tobacco: Never Used  . Alcohol Use: No  . Drug Use: No  . Sexual Activity: Not on file   Other Topics Concern  . Not on file   Social History Narrative   Married.   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  No drug abuse.          FAMILY HISTORY: Family History  Problem Relation Age of Onset  . Heart disease Father   . Colon cancer Neg Hx   . Esophageal cancer Neg Hx    . Rectal cancer Neg Hx   . Stomach cancer Neg Hx     ALLERGIES:  has No Known Allergies.  MEDICATIONS:  Current Outpatient Prescriptions  Medication Sig Dispense Refill  . hydrOXYzine (ATARAX/VISTARIL) 25 MG tablet 1 tab po qhs prn insomnia  30 tablet  1  . Multiple Vitamin (MULTIVITAMIN) tablet Take 1 tablet by mouth daily.      . pantoprazole (PROTONIX) 40 MG tablet Take 1 tablet (40 mg total) by mouth daily.  90 tablet  3  . propranolol (INDERAL) 40 MG tablet Take 1 tablet (40 mg total) by mouth 2 (two) times daily.  180 tablet  3  . VIAGRA 100 MG tablet       . HYDROcodone-acetaminophen (NORCO/VICODIN) 5-325 MG per tablet Take 1 tablet by mouth every 6 (six) hours as needed for moderate pain.  30 tablet  0  . tadalafil (CIALIS) 5 MG tablet 1 tab po qd for erectile dysfunction  90 tablet  4  . zoster vaccine live, PF, (ZOSTAVAX) 57017 UNT/0.65ML injection Inject 19,400 Units into the skin once.  1 vial  0   No current facility-administered medications for this visit.    REVIEW OF SYSTEMS:   Constitutional: Denies fevers, chills or abnormal night sweats Eyes: Denies blurriness of vision, double vision or watery eyes Ears, nose, mouth, throat, and face: Denies mucositis or sore throat Respiratory: Denies cough, dyspnea or wheezes Cardiovascular: Denies palpitation, chest discomfort or lower extremity swelling Gastrointestinal:  Denies nausea, heartburn or change in bowel habits Neurological:Denies numbness, tingling or new weaknesses Behavioral/Psych: Mood is stable, no new changes  All other systems were reviewed with the patient and are negative.  PHYSICAL EXAMINATION: ECOG PERFORMANCE STATUS: 0 - Asymptomatic  Filed Vitals:   02/18/14 1413  BP: 125/71  Pulse: 58  Temp: 98.4 F (36.9 C)  Resp: 18   Filed Weights   02/18/14 1413  Weight: 175 lb 6.4 oz (79.561 kg)    GENERAL:alert, no distress and comfortable SKIN: Numerous keratoma. Nothing suspicious for  cancer EYES: normal, conjunctiva are pink and non-injected, sclera clear OROPHARYNX:no exudate, no erythema and lips, buccal mucosa, and tongue normal  NECK: supple, thyroid normal size, non-tender, without nodularity LYMPH:  palpable lymphadenopathy in the right cervical region, none in the axillary or inguinal LUNGS: clear to auscultation and percussion with normal breathing effort HEART: regular rate & rhythm and no murmurs and no lower extremity edema ABDOMEN:abdomen soft, non-tender and normal bowel sounds. Well-healed surgical scar. Absent right testicle Musculoskeletal:no cyanosis of digits and no clubbing  PSYCH: alert & oriented x 3 with fluent speech NEURO: no focal motor/sensory deficits  LABORATORY DATA:  I have reviewed the data as listed Lab Results  Component Value Date   WBC 8.5 02/02/2014   HGB 13.2 02/02/2014   HCT 39.8 02/02/2014  MCV 89.2 02/02/2014   PLT 184.0 02/02/2014    Recent Labs  05/13/13 0953 11/20/13 0835 02/02/14 0959  NA 138 138 135  K 5.0 5.0 4.4  CL 106 107 102  CO2 25 25 26   GLUCOSE 94 70 105*  BUN 19 13 10   CREATININE 0.95 1.0 0.9  CALCIUM 9.0 8.9 9.0  PROT 6.3 6.2 6.2  ALBUMIN 3.9 3.6 3.7  AST 23 24 26   ALT 13 13 12   ALKPHOS 82 72 77  BILITOT 0.7 0.6 1.2    RADIOGRAPHIC STUDIES: I have personally reviewed the radiological images as listed and agreed with the findings in the report. Ct Soft Tissue Neck W Contrast  02/03/2014   CLINICAL DATA:  Right neck mass.  EXAM: CT NECK WITH CONTRAST  TECHNIQUE: Multidetector CT imaging of the neck was performed using the standard protocol following the bolus administration of intravenous contrast.  CONTRAST:  129mL OMNIPAQUE IOHEXOL 300 MG/ML  SOLN  COMPARISON:  None.  FINDINGS: Large complex partially necrotic right level 2 adenopathy spanning over 3.2 x 4.6 x 4.8 cm extends from the posterior aspect of the right submandibular gland to the posterior aspect of the right sternocleidomastoid muscle abutting  the right carotid artery and potentially invading the right sternocleidomastoid muscle. Compression of the right internal jugular vein (dominant drainage on the left). Appearance is consistent with malignant adenopathy with findings highly suspicious for extracapsular spread.  Largest left-sided lymph node left level 2 region with transverse dimension of 1.5 x 1.1 cm.  The source of the right neck adenopathy is indeterminate. There is slight asymmetry of the posterior superior nasopharynx with minimal fullness on the right. Suggestion of mild fullness right tonsil/ tonsil-glossal sulcus. Direct visualization recommended to determine if this represents the patient's primary malignancy.  Three upper lobe pulmonary nodules (measuring up to 9 mm) may represent metastatic disease. Chest CT can be obtained for further delineation. Patient also has a history of testicular cancer.  Prominent carotid bifurcation calcifications. Greater than 80% diameter stenosis left internal carotid artery and 68% diameter stenosis right internal carotid artery.  9 mm thyroid lesion (Inferior aspect near substernal extension of thyroid tissue).  Mild distortion of the lower cervical and upper thoracic trachea and may reflect a component of tracheomalacia.  Dental disease.  Exophthalmos.  Mucosal thickening right maxillary sinus.  Cervical spondylotic changes with various degrees of spinal stenosis and foraminal narrowing.  IMPRESSION: Right neck mass corresponds to malignant appearing level 2 adenopathy with extracapsular spread. Source indeterminate with potential primary sites as detailed above. Direct visualization of the mucosa will be necessary for further delineation.  Three right upper lobe pulmonary nodules may represent metastatic disease. CT chest can be obtained for further delineation.  Greater than 80% diameter stenosis left internal carotid artery and 68% diameter stenosis right internal carotid artery.  Please see above for  additional findings.   Electronically Signed   By: [name] Cruel M.D.   On: 02/03/2014 07:52    ASSESSMENT & PLAN: His case was reviewed at the ENT tumor board this morning. Squamous cell carcinoma The origin of the cancer is unknown. PET CT scan has been ordered for tomorrow. I will call the patient once I have the test results to determine the next course of action.  Metastasis to lymph nodes As mentioned above, PET/CT scan will help to determine the next course of action and plan of care.  Multiple lung nodules This is worrisome for metastatic disease. CT scan is pending.  He is not symptomatic.  History of testicular cancer Clinically, he has no evidence of disease.  Hearing deficit This may limit treatment options.  Skin lesions He has numerous skin lesions. I recommend dermatology review in the future.  Neck pain We discussed pain management. I refilled his pain prescription. We discussed about narcotic refill policy.      All questions were answered. The patient knows to call the clinic with any problems, questions or concerns. I spent 55 minutes counseling the patient face to face. The total time spent in the appointment was 60 minutes and more than 50% was on counseling.     [place], [place], MD 02/18/2014 10:50 PM

## 2014-02-18 NOTE — Assessment & Plan Note (Signed)
This is worrisome for metastatic disease. CT scan is pending. He is not symptomatic.

## 2014-02-18 NOTE — Assessment & Plan Note (Signed)
The origin of the cancer is unknown. PET CT scan has been ordered for tomorrow. I will call the patient once I have the test results to determine the next course of action.

## 2014-02-18 NOTE — Progress Notes (Signed)
Checked in new pt with no financial concerns. °

## 2014-02-18 NOTE — Assessment & Plan Note (Signed)
Clinically, he has no evidence of disease.

## 2014-02-18 NOTE — Assessment & Plan Note (Signed)
As mentioned above, PET/CT scan will help to determine the next course of action and plan of care.

## 2014-02-18 NOTE — Assessment & Plan Note (Signed)
We discussed pain management. I refilled his pain prescription. We discussed about narcotic refill policy.

## 2014-02-18 NOTE — Assessment & Plan Note (Signed)
This may limit treatment options.

## 2014-02-19 ENCOUNTER — Telehealth: Payer: Self-pay | Admitting: Hematology and Oncology

## 2014-02-19 ENCOUNTER — Other Ambulatory Visit: Payer: Self-pay | Admitting: *Deleted

## 2014-02-19 ENCOUNTER — Ambulatory Visit (INDEPENDENT_AMBULATORY_CARE_PROVIDER_SITE_OTHER)
Admission: RE | Admit: 2014-02-19 | Discharge: 2014-02-19 | Disposition: A | Discharge status: Home / Self Care | Payer: BC Managed Care – PPO | Source: Ambulatory Visit | Attending: Vascular Surgery | Admitting: Vascular Surgery

## 2014-02-19 ENCOUNTER — Encounter: Payer: Self-pay | Admitting: Hematology and Oncology

## 2014-02-19 ENCOUNTER — Telehealth: Payer: Self-pay | Admitting: Internal Medicine

## 2014-02-19 ENCOUNTER — Other Ambulatory Visit: Payer: Self-pay | Admitting: Hematology and Oncology

## 2014-02-19 ENCOUNTER — Ambulatory Visit (INDEPENDENT_AMBULATORY_CARE_PROVIDER_SITE_OTHER): Payer: BC Managed Care – PPO | Admitting: Vascular Surgery

## 2014-02-19 ENCOUNTER — Other Ambulatory Visit (HOSPITAL_BASED_OUTPATIENT_CLINIC_OR_DEPARTMENT_OTHER): Payer: BC Managed Care – PPO

## 2014-02-19 ENCOUNTER — Encounter: Payer: Self-pay | Admitting: Vascular Surgery

## 2014-02-19 ENCOUNTER — Ambulatory Visit (HOSPITAL_COMMUNITY)
Admission: RE | Admit: 2014-02-19 | Discharge: 2014-02-19 | Disposition: A | Discharge status: Home / Self Care | Payer: BC Managed Care – PPO | Source: Ambulatory Visit | Attending: Otolaryngology | Admitting: Otolaryngology

## 2014-02-19 VITALS — BP 118/72 | HR 58 | Temp 98.0°F | Resp 14 | Ht 66.0 in | Wt 176.0 lb

## 2014-02-19 DIAGNOSIS — C779 Secondary and unspecified malignant neoplasm of lymph node, unspecified: Secondary | ICD-10-CM

## 2014-02-19 DIAGNOSIS — I6529 Occlusion and stenosis of unspecified carotid artery: Secondary | ICD-10-CM

## 2014-02-19 DIAGNOSIS — K6389 Other specified diseases of intestine: Secondary | ICD-10-CM

## 2014-02-19 DIAGNOSIS — Z8547 Personal history of malignant neoplasm of testis: Secondary | ICD-10-CM

## 2014-02-19 DIAGNOSIS — R221 Localized swelling, mass and lump, neck: Secondary | ICD-10-CM | POA: Diagnosis not present

## 2014-02-19 DIAGNOSIS — R220 Localized swelling, mass and lump, head: Secondary | ICD-10-CM | POA: Diagnosis not present

## 2014-02-19 DIAGNOSIS — K639 Disease of intestine, unspecified: Secondary | ICD-10-CM | POA: Diagnosis not present

## 2014-02-19 DIAGNOSIS — C760 Malignant neoplasm of head, face and neck: Secondary | ICD-10-CM

## 2014-02-19 DIAGNOSIS — C7800 Secondary malignant neoplasm of unspecified lung: Secondary | ICD-10-CM | POA: Insufficient documentation

## 2014-02-19 DIAGNOSIS — IMO0002 Reserved for concepts with insufficient information to code with codable children: Secondary | ICD-10-CM

## 2014-02-19 DIAGNOSIS — C801 Malignant (primary) neoplasm, unspecified: Secondary | ICD-10-CM

## 2014-02-19 HISTORY — DX: Other specified diseases of intestine: K63.89

## 2014-02-19 LAB — GLUCOSE, CAPILLARY: GLUCOSE-CAPILLARY: 102 mg/dL — AB (ref 70–99)

## 2014-02-19 MED ORDER — FLUDEOXYGLUCOSE F - 18 (FDG) INJECTION
8.9000 | Freq: Once | INTRAVENOUS | Status: AC | PRN
  Administered 2014-02-19: 8.9 via INTRAVENOUS

## 2014-02-19 NOTE — Progress Notes (Signed)
[facility] [place] HISTORY AND PHYSICAL  Referring physician: Jodi Marble M.D. History of Present Illness:  Patient is a 62 y.o. year old male who presents for evaluation of carotid stenosis. The patient denies prior history of TIA amaurosis or stroke. He was noted to have carotid stenosis on a recent CT scan for evaluation of a right neck mass. He is not currently on aspirin. He recently had a PET scan which showed hypermetabolic activity in the right neck as well as the colon.  Other medical problems include hypertension, tobacco abuse, cirrhosis, history of testicular cancer, history of Barrett's esophagus.  He is scheduled to see his oncologist later today regarding the findings of his PET scan.  Past Medical History  Diagnosis Date  . Hypertension   . Low HDL (under 40)   . Degenerative disc disease, lumbar   . Erectile dysfunction   . Tobacco dependence   . Peripheral edema     legs.  07/2012 bilateral venous/art dopplers via ortho were neg for DVT or Picking's cyst but interstitial fluid noted by ultrasonographer  . Chronic low back pain   . 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  . 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 History of testicular cancer 1979    Testicle removed, extensive abd/pelvic lymph lymph node dissection  . Alcoholism     Records show he was on this as early as 12/2007:  he has been completely "dry" since 07/2012.  Marland Kitchen Perennial allergic rhinitis   . GERD (gastroesophageal reflux disease)     +Barretts esophagus on 10/07/12 EGD  . Asymptomatic varicose veins 12/25/2007  . Blepharitis of both eyes 10/30/2012  . Neck pain 02/18/2014  . Colonic mass 02/19/2014    Past Surgical History  Procedure Laterality Date  . Appendectomy    . Lumbar disc surgery  01/20/2009    Dr.'s Beane and Gioffre (L5/S1 hemilaminectomy)  .  Transthoracic echocardiogram  2/35/36    Diastolic dysfunction, mild LAE, otherwise all normal.  . Tonsillectomy    . Esophagogastroduodenoscopy  10/07/12    No varices.  Barretts esophagus+ benign gastric nodule noted.  No dysplasia or malignancy.  . Colonoscopy  2013    per pt report-normal  . Testicular removal    . Lymph node dissection      Social History History  Substance Use Topics  . Smoking status: Former Smoker -- 1.00 packs/day for 20 years    Types: Cigarettes  . Smokeless tobacco: Never Used  . Alcohol Use: No    Family History Family History  Problem Relation Age of Onset  . Heart disease Father   . Colon cancer Neg Hx   . Esophageal cancer Neg Hx   . Rectal cancer Neg Hx   . Stomach cancer Neg Hx     Allergies  No Known Allergies   Current Outpatient Prescriptions  Medication Sig Dispense Refill  . HYDROcodone-acetaminophen (NORCO/VICODIN) 5-325 MG per tablet Take 1 tablet by mouth every 6 (six) hours as needed for moderate pain.  30 tablet  0  . hydrOXYzine (ATARAX/VISTARIL) 25 MG tablet 1 tab po qhs prn insomnia  30 tablet  1  . Multiple Vitamin (MULTIVITAMIN) tablet Take 1 tablet by mouth daily.      . pantoprazole (PROTONIX) 40 MG tablet Take 1 tablet (40 mg total) by  mouth daily.  90 tablet  3  . propranolol (INDERAL) 40 MG tablet Take 1 tablet (40 mg total) by mouth 2 (two) times daily.  180 tablet  3  . tadalafil (CIALIS) 5 MG tablet 1 tab po qd for erectile dysfunction  90 tablet  4  . VIAGRA 100 MG tablet       . zoster vaccine live, PF, (ZOSTAVAX) 15830 UNT/0.65ML injection Inject 19,400 Units into the skin once.  1 vial  0   No current facility-administered medications for this visit.    ROS:   General:  No weight loss, Fever, chills  HEENT: No recent headaches, no nasal bleeding, no visual changes, no sore throat  Neurologic: No dizziness, blackouts, seizures. No recent symptoms of stroke or mini- stroke. No recent episodes of slurred  speech, or temporary blindness.  Cardiac: No recent episodes of chest pain/pressure, no shortness of breath at rest.  No shortness of breath with exertion.  Denies history of atrial fibrillation or irregular heartbeat  Vascular: No history of rest pain in feet.  No history of claudication.  No history of non-healing ulcer, No history of DVT   Pulmonary: No home oxygen, no productive cough, no hemoptysis,  No asthma or wheezing  Musculoskeletal:  [ ]  Arthritis, [ ]  Low back pain,  [ ]  Joint pain  Hematologic:No history of hypercoagulable state.  No history of easy bleeding.  No history of anemia  Gastrointestinal: No hematochezia or melena,  No gastroesophageal reflux, no trouble swallowing  Urinary: [ ]  chronic Kidney disease, [ ]  on HD - [ ]  MWF or [ ]  TTHS, [ ]  Burning with urination, [ ]  Frequent urination, [ ]  Difficulty urinating;   Skin: No rashes  Psychological: No history of anxiety,  No history of depression   Physical Examination  Filed Vitals:   02/19/14 1450 02/19/14 1454  BP: 131/74 118/72  Pulse: 60 58  Temp: 98 F (36.7 C)   TempSrc: Oral   Resp: 14   Height: 5\' 6"  (1.676 m)   Weight: 176 lb (79.833 kg)   SpO2: 96%     Body mass index is 28.42 kg/(m^2).  General:  Alert and oriented, no acute distress HEENT: Normal Neck: No bruit or JVD, 5 cm firm fixed mass right neck non-pulsatile, 2+ left carotid pulse Pulmonary: Clear to auscultation bilaterally Cardiac: Regular Rate and Rhythm without murmur Skin: No rash Extremity Pulses:  2+ radial, brachial pulses bilaterally Musculoskeletal: No deformity or edema  Neurologic: Upper and lower extremity motor 5/5 and symmetric  DATA:  I reviewed the patient's CT scan of the neck which was read as 60% right internal carotid artery stenosis 80% left carotid stenosis. There is significant calcification at the carotid bifurcation and I believe this easily overestimates the level of stenosis.  A carotid duplex scan  was performed in our office today. I reviewed and interpreted the findings. This showed less than 40% carotid stenosis bilaterally. However, this may be slightly underestimated on the right side due to calcification of the carotid artery.   ASSESSMENT:  Patient with mild to moderate bilateral carotid stenosis currently asymptomatic. The patient is undergoing workup currently for a head and neck cancer and potentially a colon cancer with possible metastatic disease. No intervention currently necessary for his carotid disease. Risk of stroke is fairly minimal at this level of stenosis.   PLAN: Needs repeat carotid duplex scan in 6 months or if develops TIA or stroke symptoms. Would recommend starting the patient  on one aspirin daily if no surgical intervention is planned in the immediate future for his possible malignancy.  Ruta Hinds, MD [facility] Office: [phone] Pager: [phone]

## 2014-02-19 NOTE — Telephone Encounter (Signed)
Left message for [facility] to call back.

## 2014-02-19 NOTE — Telephone Encounter (Signed)
I reviewed the PET scan result with the patient. I also spoke with his ENT surgeon. I will bring him back today for tumor markers and sent him to GI for urgent evaluation with colonoscopy. I will see him back next week to review test results.

## 2014-02-19 NOTE — Telephone Encounter (Signed)
Pt confirmed labs/ov per 08/20 POF, gave pt AVS...KJ °

## 2014-02-19 NOTE — Telephone Encounter (Addendum)
Per Tammi 08/20 POF, add labs for 08/20 pt is here...Marland KitchenMarland KitchenKJ

## 2014-02-20 ENCOUNTER — Telehealth: Payer: Self-pay | Admitting: Hematology and Oncology

## 2014-02-20 NOTE — Telephone Encounter (Signed)
Requesting pt be seen, pt needs a colon done. Pt has colonic mass. Requested pt be seen sooner than 1st available. Pt scheduled to see Amy Esterwood PA 03/03/14@1 :30pm. Shamika to notify pt of appt.

## 2014-02-20 NOTE — Telephone Encounter (Signed)
s.w. pt and advised on 9.1 appt with Amy Dr. Garth Schlatter PA @ 1:30pm...Dr. Garth Schlatter first appt is OCT.Marland KitchenMarland Kitchen

## 2014-02-23 ENCOUNTER — Telehealth: Payer: Self-pay

## 2014-02-23 NOTE — Telephone Encounter (Signed)
Spoke with pt and scheduled pt for previsit this week and colon 03/02/14@11 :30am. Pt aware of appts. Unable to find previous colon reports, pt does not remember when his last one was or who did it.

## 2014-02-23 NOTE — Telephone Encounter (Signed)
Message copied by Algernon Huxley on Mon Feb 23, 2014 10:11 AM ------      Message from: Jerene Bears      Created: Mon Feb 23, 2014  9:16 AM      Regarding: FW: Need colonic mass       Vaughan Basta      This pt needs colonoscopy for probable colon cancer seen on PET scan       This needs to be done soon, if not possible by my schedule, then I will ask a partner      Also, per my last clinic visit with him, he had a colonoscopy in 2009 and we had requested records, which I am not sure were ever received. Could you check the chart for previous colonoscopy?       Thanks      JMP            ----- Message -----         From: Heath Lark, MD         Sent: 02/19/2014   1:55 PM           To: Jerene Bears, MD      Subject: Need colonic mass                                        Hi Dr. Hilarie Fredrickson,            I sent a consult to you on this old patient with metastatic cancer on PET scan. He needs urgent colonoscopy evaluation. Incidentally he has newly diagnosed squamous cell cancer as well. I appreciate your help            Ni       ------

## 2014-02-24 ENCOUNTER — Telehealth: Payer: Self-pay | Admitting: *Deleted

## 2014-02-24 LAB — CEA: CEA: 5.6 ng/mL — ABNORMAL HIGH (ref 0.0–5.0)

## 2014-02-24 LAB — BETA HCG QUANT (REF LAB): Beta hCG, Tumor Marker: <2 m[IU]/mL (ref <5.0)

## 2014-02-24 LAB — AFP TUMOR MARKER: AFP-Tumor Marker: 1.8 ng/mL (ref 0.0–8.0)

## 2014-02-24 NOTE — Telephone Encounter (Signed)
Called pt and advised to stop eating nuts, seeds, popcorn, corn, bean, peas, salads or any raw vegetable from now til procedure on 8/31, pt has previsit on thurs 8/27 but not enough time between previsit and procedure, so called pt to advise, pt and wife both verbalized understanding-adm

## 2014-02-24 NOTE — Progress Notes (Addendum)
Head and Neck Cancer Location of Tumor / Histology:    As noted on a 02/06/14 note: Jared Garrett Referred to Dr. Jodi Marble, by Dr. Williemae Natter for evaluation of a large right neck mass. Jared Garrett had noticed some early swelling maybe 2 months prior to this visit, was given antibioitics without any resolution.  The mass continued to get larger and somewhat tender.  He did not have any change in voice, pain nor difficulty swallowing, or any weight loss.  He described numbness of the right auricular lobe.  CT on 02/03/14: Right neck mass corresponds to malignant appearing level 2 adenopathy with extracapsular spread. Source indeterminate with potential primary sites as detailed above. Direct visualization of the mucosa will be necessary for further delineation. Three right upper lobe pulmonary nodules may represent metastatic disease. CT chest can be obtained for further delineation.  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 THERE IS LIMITED TUMOR IN THE CELL BLOCK. A P16 IMMUNOSTAIN WILL BE ATTEMPTED WITH THE RESULT REPORTED IN AN ADDENDUM Nutrition Status:  Weight changes:No  Swallowing status: No  Plans, if any, for PEG tube: no patient unaware  Tobacco/Marijuana/Snuff/ETOH use: 1 pack/day x 20+ years, stopped on 02/02/14,heavy drinker in the past and stopped 2 years ago ( cirrhosis of liver)  Past/Anticipated interventions by otolaryngology, if any: Dr. Jodi Marble: Biospy of the Right Level II Lymph Node  Past/Anticipated interventions by medical oncology, if any: Seen by Dr. Heath Lark  Referrals yet, to any of the following?  Social Work? yes  Dentistry? no  Swallowing therapy? no  Nutrition? no  Med/Onc? no  PEG placement? no  SAFETY  ISSUES:  Prior radiation? No  Pacemaker/ICD? No  Possible current pregnancy?N/A  Is the patient on methotrexate?   Current Complaints / other details:  Right neck swelling, discomfort,   Takes hydrocodone 5/325mg  prn requesting refill, nervous Colonoscopy scheduled this Monday 03/02/14

## 2014-02-25 ENCOUNTER — Ambulatory Visit
Admission: RE | Admit: 2014-02-25 | Discharge: 2014-02-25 | Disposition: A | Discharge status: Home / Self Care | Payer: BC Managed Care – PPO | Source: Ambulatory Visit | Attending: Radiation Oncology | Admitting: Radiation Oncology

## 2014-02-25 ENCOUNTER — Encounter: Payer: Self-pay | Admitting: Radiation Oncology

## 2014-02-25 ENCOUNTER — Telehealth: Payer: Self-pay | Admitting: *Deleted

## 2014-02-25 ENCOUNTER — Encounter: Payer: Self-pay | Admitting: *Deleted

## 2014-02-25 VITALS — BP 112/60 | HR 60 | Temp 98.2°F | Resp 20 | Ht 66.0 in | Wt 176.3 lb

## 2014-02-25 DIAGNOSIS — Z510 Encounter for antineoplastic radiation therapy: Secondary | ICD-10-CM | POA: Insufficient documentation

## 2014-02-25 DIAGNOSIS — N529 Male erectile dysfunction, unspecified: Secondary | ICD-10-CM | POA: Diagnosis not present

## 2014-02-25 DIAGNOSIS — C801 Malignant (primary) neoplasm, unspecified: Secondary | ICD-10-CM

## 2014-02-25 DIAGNOSIS — C7800 Secondary malignant neoplasm of unspecified lung: Secondary | ICD-10-CM | POA: Diagnosis not present

## 2014-02-25 DIAGNOSIS — C792 Secondary malignant neoplasm of skin: Secondary | ICD-10-CM | POA: Diagnosis not present

## 2014-02-25 DIAGNOSIS — Z79899 Other long term (current) drug therapy: Secondary | ICD-10-CM | POA: Insufficient documentation

## 2014-02-25 DIAGNOSIS — K6389 Other specified diseases of intestine: Secondary | ICD-10-CM | POA: Diagnosis not present

## 2014-02-25 DIAGNOSIS — Z8547 Personal history of malignant neoplasm of testis: Secondary | ICD-10-CM | POA: Insufficient documentation

## 2014-02-25 DIAGNOSIS — Z87891 Personal history of nicotine dependence: Secondary | ICD-10-CM | POA: Insufficient documentation

## 2014-02-25 DIAGNOSIS — I10 Essential (primary) hypertension: Secondary | ICD-10-CM | POA: Diagnosis not present

## 2014-02-25 DIAGNOSIS — M542 Cervicalgia: Secondary | ICD-10-CM

## 2014-02-25 DIAGNOSIS — K7030 Alcoholic cirrhosis of liver without ascites: Secondary | ICD-10-CM | POA: Diagnosis not present

## 2014-02-25 DIAGNOSIS — K219 Gastro-esophageal reflux disease without esophagitis: Secondary | ICD-10-CM | POA: Diagnosis not present

## 2014-02-25 DIAGNOSIS — C770 Secondary and unspecified malignant neoplasm of lymph nodes of head, face and neck: Secondary | ICD-10-CM

## 2014-02-25 DIAGNOSIS — IMO0002 Reserved for concepts with insufficient information to code with codable children: Secondary | ICD-10-CM

## 2014-02-25 HISTORY — DX: Anxiety disorder, unspecified: F41.9

## 2014-02-25 HISTORY — DX: Malignant (primary) neoplasm, unspecified: C80.1

## 2014-02-25 MED ORDER — HYDROCODONE-ACETAMINOPHEN 5-325 MG PO TABS: 1.0000 | ORAL_TABLET | Freq: Four times a day (QID) | ORAL | Status: DC | PRN

## 2014-02-25 NOTE — Telephone Encounter (Signed)
Called [facility] for an :"8" screening, will e-mail her and Abigail Elmore,Greer Hock 9:24 AM

## 2014-02-25 NOTE — Progress Notes (Signed)
Please see the Nurse Progress Note in the MD Initial Consult Encounter for this patient. 

## 2014-02-25 NOTE — Progress Notes (Signed)
Met with patient and his wife during initial consult with Dr. Isidore Moos.  Introduced myself as Designer, television/film set, explained my role as a member of the Care Team, provided contact information, encouraged them to contact me with questions/concerns as treatments/procedures begin.  Provided introductory explanation of radiation treatment including SIM planning and fitting of head mask, showed them example of mask.  They indicated understanding.    Initiating navigation as L1 patient (new) with this encounter.  Gayleen Orem, RN, BSN, [facility] [phone]

## 2014-02-25 NOTE — Progress Notes (Signed)
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 Possible colon cancer.  HISTORY OF PRESENT ILLNESS::Jared Garrett is a 63 y.o. male who had a two-month history of right neck swelling. This did not respond to antibiotics. CT scan on 02/03/2014 showed a right neck mass consistent with level II adenopathy / capsular extension. No clear mucosal primary in the head and neck region. Biopsy on 02/06/2014 yielded scans tissue at the malignant cells were consistent with squamous cell carcinoma. P. 16 negative - there testing is limited by scant tissue. August 20 PET scan unfortunately shows multiple lesions in the lungs consistent with metastases. I reviewed his PET images with him and his wife today. He also has lesions in the colon concerning for colon cancer. Gastroenterology colonoscopy in 5 days;  medical oncology appointment is in 2 days to follow up with Dr. Alvy Bimler.  He has smoked one pack per day for 20 or more years. He stopped earlier this month. He was a heavy drinker of alcohol in the past but stopped 2 years ago.The mass of his right neck has continued to get larger and somewhat tender. He did not have any change in voice, pain nor difficulty swallowing, or any weight loss.  History of testicular cancer in the last 1970s, treated with surgery only.  He does not believe he's had a prior colonoscopy.  He denies GI symptoms such as blood in stool, change in bowel habits, or belly pain.  PREVIOUS RADIATION THERAPY: No  PAST MEDICAL HISTORY:  has a past medical history of Hypertension; Low HDL (under 40); Degenerative disc disease, lumbar; Erectile dysfunction; Tobacco dependence;  Peripheral edema; Chronic low back pain; Hearing impairment; Alcoholic cirrhosis of liver with ascites (07/2012); History of testicular cancer (1979); Alcoholism; Perennial allergic rhinitis; GERD (gastroesophageal reflux disease); Asymptomatic varicose veins (12/25/2007); Blepharitis of both eyes (10/30/2012); Neck pain (02/18/2014); Colonic mass (02/19/2014); Cancer (02/06/14); and Anxiety.    PAST SURGICAL HISTORY: Past Surgical History  Procedure Laterality Date  . Appendectomy    . Lumbar disc surgery  01/20/2009    Dr.'s Beane and Gioffre (L5/S1 hemilaminectomy)  . Transthoracic echocardiogram  07/24/42    Diastolic dysfunction, mild LAE, otherwise all normal.  . Tonsillectomy    . Esophagogastroduodenoscopy  10/07/12    No varices.  Barretts esophagus+ benign gastric nodule noted.  No dysplasia or malignancy.  . Colonoscopy  2013    per pt report-normal  . Testicular removal    . Lymph node dissection      FAMILY HISTORY: family history includes Heart disease in his father. There is no history of Colon cancer, Esophageal cancer, Rectal cancer, or Stomach cancer.  SOCIAL HISTORY:  reports that he has quit smoking. His smoking use included Cigarettes. He has a 20 pack-year smoking history. He has never used smokeless tobacco. He reports that he does not drink alcohol or use illicit drugs.  ALLERGIES: Review of patient's allergies indicates no known allergies.  MEDICATIONS:  Current Outpatient Prescriptions  Medication Sig Dispense Refill  . HYDROcodone-acetaminophen (NORCO/VICODIN) 5-325 MG per tablet Take 1 tablet by mouth every 6 (six) hours as needed for moderate pain.  12 tablet  0  . Multiple Vitamin (MULTIVITAMIN) tablet Take 1 tablet  by mouth daily.      . pantoprazole (PROTONIX) 40 MG tablet Take 1 tablet (40 mg total) by mouth daily.  90 tablet  3  . propranolol (INDERAL) 40 MG tablet Take 1 tablet (40 mg total) by mouth 2 (two) times daily.  180 tablet  3  . tadalafil (CIALIS) 5  MG tablet 1 tab po qd for erectile dysfunction  90 tablet  4  . VIAGRA 100 MG tablet       . hydrOXYzine (ATARAX/VISTARIL) 25 MG tablet 1 tab po qhs prn insomnia  30 tablet  1  . zoster vaccine live, PF, (ZOSTAVAX) 01027 UNT/0.65ML injection Inject 19,400 Units into the skin once.  1 vial  0   No current facility-administered medications for this encounter.    REVIEW OF SYSTEMS:  Notable for that above.   PHYSICAL EXAM:  height is _0  (1.676 m) and weight is 176 lb 4.8 oz (79.969 kg). His oral temperature is 98.2 F (36.8 C). His blood pressure is 112/60 and his pulse is 60. His respiration is 20 and oxygen saturation is 99%.   General: Alert and oriented, in no acute distress HEENT: Head is normocephalic. Extraocular movements are intact. Oropharynx is clear. Neck: large right neck mass, levels 2/3, about 5cm Heart: Regular in rate and rhythm with no murmurs, rubs, or gallops. Chest: Clear to auscultation bilaterally, with no rhonchi, wheezes, or rales. Abdomen: Soft, nontender, nondistended, with no rigidity or guarding. Extremities: No cyanosis or edema. Lymphatics: as above Skin: seborhheic keratoses over scalp. Multiple moles Musculoskeletal: symmetric strength and muscle tone throughout. Neurologic: Cranial nerves II through XII are grossly intact. No obvious focalities. Speech is fluent. Coordination is intact. Psychiatric: Judgment and insight are intact. Affect is appropriate.  ECOG = 1  0 - Asymptomatic (Fully active, able to carry on all predisease activities without restriction)  1 - Symptomatic but completely ambulatory (Restricted in physically strenuous activity but ambulatory and able to carry out work of a light or sedentary nature. For example, light housework, office work)  2 - Symptomatic, <50% in bed during the day (Ambulatory and capable of all self care but unable to carry out any work activities. Up and about more than 50% of waking hours)  3 - Symptomatic,  >50% in bed, but not bedbound (Capable of only limited self-care, confined to bed or chair 50% or more of waking hours)  4 - Bedbound (Completely disabled. Cannot carry on any self-care. Totally confined to bed or chair)  5 - Death   Eustace Pen MM, Creech RH, Tormey DC, et al. [phone]). "Toxicity and response criteria of the [facility]". [place] Oncol. 5 (6): 649-55   LABORATORY DATA:  Lab Results  Component Value Date   WBC 8.5 02/02/2014   HGB 13.2 02/02/2014   HCT 39.8 02/02/2014   MCV 89.2 02/02/2014   PLT 184.0 02/02/2014   CMP     Component Value Date/Time   NA 135 02/02/2014 0959   K 4.4 02/02/2014 0959   CL 102 02/02/2014 0959   CO2 26 02/02/2014 0959   GLUCOSE 105* 02/02/2014 0959   GLUCOSE 101* 05/28/2006 0830   BUN 10 02/02/2014 0959   CREATININE 0.9 02/02/2014 0959   CREATININE 0.95 05/13/2013 0953   CALCIUM 9.0 02/02/2014 0959   PROT 6.2 02/02/2014 0959   ALBUMIN 3.7 02/02/2014 0959   AST 26 02/02/2014 0959   ALT 12 02/02/2014 0959   ALKPHOS 77 02/02/2014 0959  BILITOT 1.2 02/02/2014 0959   GFRNONAA >60 01/22/2009 0535   GFRAA  Value: >60        The eGFR has been calculated using the MDRD equation. This calculation has not been validated in all clinical situations. eGFR's persistently <60 mL/min signify possible Chronic Kidney Disease. 01/22/2009 0535         RADIOGRAPHY: Ct Soft Tissue Neck W Contrast  02/03/2014   CLINICAL DATA:  Right neck mass.  EXAM: CT NECK WITH CONTRAST  TECHNIQUE: Multidetector CT imaging of the neck was performed using the standard protocol following the bolus administration of intravenous contrast.  CONTRAST:  159m OMNIPAQUE IOHEXOL 300 MG/ML  SOLN  COMPARISON:  None.  FINDINGS: Large complex partially necrotic right level 2 adenopathy spanning over 3.2 x 4.6 x 4.8 cm extends from the posterior aspect of the right submandibular gland to the posterior aspect of the right sternocleidomastoid muscle abutting the right carotid artery and potentially  invading the right sternocleidomastoid muscle. Compression of the right internal jugular vein (dominant drainage on the left). Appearance is consistent with malignant adenopathy with findings highly suspicious for extracapsular spread.  Largest left-sided lymph node left level 2 region with transverse dimension of 1.5 x 1.1 cm.  The source of the right neck adenopathy is indeterminate. There is slight asymmetry of the posterior superior nasopharynx with minimal fullness on the right. Suggestion of mild fullness right tonsil/ tonsil-glossal sulcus. Direct visualization recommended to determine if this represents the patient's primary malignancy.  Three upper lobe pulmonary nodules (measuring up to 9 mm) may represent metastatic disease. Chest CT can be obtained for further delineation. Patient also has a history of testicular cancer.  Prominent carotid bifurcation calcifications. Greater than 80% diameter stenosis left internal carotid artery and 68% diameter stenosis right internal carotid artery.  9 mm thyroid lesion (Inferior aspect near substernal extension of thyroid tissue).  Mild distortion of the lower cervical and upper thoracic trachea and may reflect a component of tracheomalacia.  Dental disease.  Exophthalmos.  Mucosal thickening right maxillary sinus.  Cervical spondylotic changes with various degrees of spinal stenosis and foraminal narrowing.  IMPRESSION: Right neck mass corresponds to malignant appearing level 2 adenopathy with extracapsular spread. Source indeterminate with potential primary sites as detailed above. Direct visualization of the mucosa will be necessary for further delineation.  Three right upper lobe pulmonary nodules may represent metastatic disease. CT chest can be obtained for further delineation.  Greater than 80% diameter stenosis left internal carotid artery and 68% diameter stenosis right internal carotid artery.  Please see above for additional findings.   Electronically  Signed   By: SChauncey CruelM.D.   On: 02/03/2014 07:52   Nm Pet Image Initial (pi) Skull Base To Thigh  02/19/2014   CLINICAL DATA:  Initial treatment strategy for head and neck carcinoma.  EXAM: NUCLEAR MEDICINE PET SKULL BASE TO THIGH  TECHNIQUE: 8.9 mCi F-18 FDG was injected intravenously. Full-ring PET imaging was performed from the skull base to thigh after the radiotracer. CT data was obtained and used for attenuation correction and anatomic localization.  FASTING BLOOD GLUCOSE:  Value: 102 mg/dl  COMPARISON:  Neck CT on 02/02/2014  FINDINGS: NECK  Hypermetabolic lymphadenopathy or mass seen in the right upper internal jugular chain, level 2. This measures approximately 5 cm in maximum diameter, and has an SUV max of 13.2.  Small sub-cm hypermetabolic lymph nodes are seen in the left neck in the left submandibular region, level 1B, and left upper  internal jugular chain, level 2B.  CHEST  No hypermetabolic mediastinal or hilar nodes. No suspicious pulmonary nodules on the CT scan.  There are several sub-cm pulmonary nodules in the right upper lobe which show hypermetabolic activity, with highest SUV max measuring 4.6. These are consistent with pulmonary metastases. Other sub-cm pulmonary nodules seen in both lungs show no significant hypermetabolic activity, but remains suspicious for other pulmonary metastases.  ABDOMEN/PELVIS  No abnormal hypermetabolic activity within the liver, pancreas, adrenal glands, or spleen.  A focal hypermetabolic mass is seen involving the proximal sigmoid colon, which has an SUV max of 11.9. This is suspicious for a primary colon carcinoma. Another focus of activity is seen in the ascending colon, which has an SUV max of 7.2. A synchronous primary carcinoma in the ascending colon cannot be excluded.  Mild hypermetabolic adenopathy is seen in the central sigmoid mesenteric which has a SUV max of 3.6, consistent with metastatic lymphadenopathy. No other sites of hypermetabolic  adenopathy identified within the abdomen or pelvis.  SKELETON  No focal hypermetabolic activity to suggest skeletal metastasis.  IMPRESSION: Hypermetabolic mass in the proximal sigmoid colon, suspicious for primary colon carcinoma. Second focus of hypermetabolic activity in the ascending colon, which could represent a synchronous primary colon carcinoma. Complete colonoscopy recommended for further evaluation.  Mild metastatic lymphadenopathy in the central sigmoid mesocolon.  Hypermetabolic pulmonary metastases in the right upper lobe, and probable other scattered bilateral pulmonary metastases.  5 cm hypermetabolic lymphadenopathy or mass in the right neck upper internal jugular chain.  Mild hypermetabolic lymphadenopathy in the left neck submandibular region and upper internal jugular chain.   Electronically Signed   By: Earle Gell M.D.   On: 02/19/2014 09:24   PATH: ADDITIONAL INFORMATION: Although there is no p16 immunostain expression within the malignant epithelium, there is a scant amount of tumor present within the cell block. (CRR:gt, 02/10/14) [place] RUND DO Pathologist, Electronic Signature ( Signed 02/10/2014) Adequacy Reason Satisfactory For Evaluation. 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 THERE IS LIMITED TUMOR IN THE CELL BLOCK. A P16 IMMUNOSTAIN WILL BE ATTEMPTED WITH THE RESULT REPORTED IN AN ADDENDUM. [place] RUND DO Pathologist, Electronic Signature (Case signed 02/09/2014)    IMPRESSION/PLAN: This patient has been discussed at our multidisciplinary tumor board. I discussed in detail with Dr. Alvy Bimler today. I told Dr.Gorsuch and the patient that I do not think radiotherapy has a role in his treatment right now. He likely has metastatic disease in his lungs.  I recommend that he continue to proceed with his  colonoscopy and then followup with medical oncology to determine his disposition for chemotherapy. The reasons to consider radiotherapy in the future would be inadequate response to chemotherapy; or, if chemotherapy needs to be delayed for any reason, 2 weeks of radiotherapy could have palliative benefit to start shrinking down his right neck mass. I will not schedule him a followup with the patient;  he may see me on a when necessary basis depending on Dr. Calton Dach assessments. I wished him the very best. He and his wife and I had a lengthy discussion and he understands that his prognosis is guarded, but our hope is that he has an excellent response to systemic treatments.  __________________________________________   Eppie Gibson, MD

## 2014-02-26 ENCOUNTER — Ambulatory Visit (AMBULATORY_SURGERY_CENTER): Payer: Self-pay

## 2014-02-26 VITALS — Ht 66.5 in | Wt 176.6 lb

## 2014-02-26 DIAGNOSIS — K6389 Other specified diseases of intestine: Secondary | ICD-10-CM

## 2014-02-26 MED ORDER — MOVIPREP 100 G PO SOLR: 1.0000 | Freq: Once | ORAL | Status: DC

## 2014-02-26 NOTE — Addendum Note (Signed)
Encounter addended by: Deirdre Evener, RN on: 02/26/2014  7:33 PM<BR>     Documentation filed: Charges VN

## 2014-02-26 NOTE — Progress Notes (Signed)
No allergies to eggs or soy No home oxygen No diet/weight loss meds No past problems with anesthesia  Has email  Emmi instructions given for colonoscopy 

## 2014-02-27 ENCOUNTER — Ambulatory Visit (HOSPITAL_BASED_OUTPATIENT_CLINIC_OR_DEPARTMENT_OTHER): Payer: BC Managed Care – PPO | Admitting: Hematology and Oncology

## 2014-02-27 ENCOUNTER — Encounter: Payer: Self-pay | Admitting: *Deleted

## 2014-02-27 ENCOUNTER — Telehealth: Payer: Self-pay | Admitting: *Deleted

## 2014-02-27 ENCOUNTER — Encounter: Payer: Self-pay | Admitting: Hematology and Oncology

## 2014-02-27 ENCOUNTER — Telehealth: Payer: Self-pay | Admitting: Hematology and Oncology

## 2014-02-27 VITALS — BP 120/68 | HR 64 | Temp 98.7°F | Resp 17 | Ht 66.5 in | Wt 175.6 lb

## 2014-02-27 DIAGNOSIS — F17200 Nicotine dependence, unspecified, uncomplicated: Secondary | ICD-10-CM

## 2014-02-27 DIAGNOSIS — C801 Malignant (primary) neoplasm, unspecified: Secondary | ICD-10-CM

## 2014-02-27 DIAGNOSIS — IMO0002 Reserved for concepts with insufficient information to code with codable children: Secondary | ICD-10-CM

## 2014-02-27 DIAGNOSIS — M542 Cervicalgia: Secondary | ICD-10-CM

## 2014-02-27 DIAGNOSIS — C779 Secondary and unspecified malignant neoplasm of lymph node, unspecified: Secondary | ICD-10-CM

## 2014-02-27 MED ORDER — LIDOCAINE-PRILOCAINE 2.5-2.5 % EX CREA: 1.0000 "application " | TOPICAL_CREAM | CUTANEOUS | Status: DC | PRN

## 2014-02-27 MED ORDER — HYDROMORPHONE HCL 2 MG PO TABS: 2.0000 mg | ORAL_TABLET | ORAL | Status: DC | PRN

## 2014-02-27 NOTE — Telephone Encounter (Signed)
Per staff message and POF I have scheduled appts. Advised scheduler of appts. JMW  

## 2014-02-27 NOTE — Telephone Encounter (Signed)
Pt confirmed labs/ov per 08/28 POF, gave pt AVS and spk w/Tiff sch port, cld confirmed pt ref to GI will be connected to pt's Colonoscopy visit w/Dr. Hilarie Fredrickson.Marland Kitchen..KJ

## 2014-02-27 NOTE — Progress Notes (Signed)
To provide support, encouragement and care continuity, met with patient and his wife during est pt appt with Dr. Alvy Bimler.  1. They denied having questions from Wednesday's appt with Dr. Isidore Moos. 2. I provided PAC education, including handout with picture/diagram, in support of Dr. Calton Dach discussion of chemotherapy. 3. They confirmed understanding of location of [facility] for pending appt for PAC placement by [facility]. 4. I reminded them to call me with any questions/concerns as procedures and treatments progress.  They verbalized understanding.  Gayleen Orem, RN, BSN, [place] [facility] [place] [phone]

## 2014-03-01 NOTE — Assessment & Plan Note (Signed)
I spent some time counseling the patient the importance of tobacco cessation. he is currently attempting to quit on his own  I gave him patient education handout and encouraged him to sign up for smoking cessation class.  

## 2014-03-01 NOTE — Assessment & Plan Note (Signed)
Clinically, he appears to have multiple hypermetabolic abnormalities within his large intestine. Colonoscopy is pending. Overall, the patient appears two have primary cancer diagnosis with possible metastatic colon cancer and metastatic squamous cell carcinoma.  If we can obtain tissue diagnosis colonoscopy, I could treat him in a palliative fashion using systemic chemotherapy that may also target his squamous cell carcinoma at the neck. I discussed with him in great detail the use of systemic chemotherapy. He would need chemotherapy education class and Infuse-a-Port placement. He agreed to proceed with plan of care.

## 2014-03-01 NOTE — Progress Notes (Signed)
[facility] OFFICE PROGRESS NOTE  Patient Care Team: Jared H McGowen, MD as PCP - General (Family Medicine) Jay M Pyrtle, MD as Consulting Physician (Gastroenterology) Karol Wolicki, MD as Consulting Physician (Otolaryngology) Ni Gorsuch, MD as Consulting Physician (Hematology and Oncology) Sarah Squire, MD as Attending Physician (Radiation Oncology) Harith Alan Diehl, RN as Oncology Nurse Navigator (Oncology)  SUMMARY OF ONCOLOGIC HISTORY: He has been complaining of a recurrence of swelling of the lymph node on the right side of his neck. This is noticeable since June of this year. He went to his primary care provider in July and CT scan show abnormal lymphadenopathy. 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 The patient had a history of significant alcoholism with Barrett's esophagus, portal hypertension and ascites. These had improved since he is abstinent from alcohol 2 years ago. The patient had a remote history of right testicular cancer, resected in 1979. He never received adjuvant chemotherapy or radiation treatment. He had numerous skin lesions on his scalp but never saw a dermatologist. PET CT scan show hypermetabolic neck mass, pulmonary metastasis, colonic mass and lymphadenopathy in the abdominal region INTERVAL HISTORY: Please see below for problem oriented charting. He feels well apart from persistent neck pain.  REVIEW OF SYSTEMS:   Constitutional: Denies fevers, chills or abnormal weight loss Eyes: Denies blurriness of vision Ears, nose, mouth, throat, and face: Denies mucositis or sore throat Respiratory: Denies cough, dyspnea or wheezes Cardiovascular: Denies palpitation, chest discomfort or lower extremity swelling Gastrointestinal:  Denies nausea, heartburn or change in bowel habits Skin: Denies abnormal skin rashes Lymphatics: Denies new lymphadenopathy or easy  bruising Neurological:Denies numbness, tingling or new weaknesses Behavioral/Psych: Mood is stable, no new changes  All other systems were reviewed with the patient and are negative.  I have reviewed the past medical history, past surgical history, social history and family history with the patient and they are unchanged from previous note.  ALLERGIES:  has No Known Allergies.  MEDICATIONS:  Current Outpatient Prescriptions  Medication Sig Dispense Refill  . HYDROcodone-acetaminophen (NORCO/VICODIN) 5-325 MG per tablet Take 1 tablet by mouth every 6 (six) hours as needed for moderate pain.  12 tablet  0  . MOVIPREP 100 G SOLR Take 1 kit (200 g total) by mouth once.  1 kit  0  . Multiple Vitamin (MULTIVITAMIN) tablet Take 1 tablet by mouth daily.      . pantoprazole (PROTONIX) 40 MG tablet Take 1 tablet (40 mg total) by mouth daily.  90 tablet  3  . propranolol (INDERAL) 40 MG tablet Take 1 tablet (40 mg total) by mouth 2 (two) times daily.  180 tablet  3  . VIAGRA 100 MG tablet       . HYDROmorphone (DILAUDID) 2 MG tablet Take 1 tablet (2 mg total) by mouth every 4 (four) hours as needed for moderate pain or severe pain.  60 tablet  0  . lidocaine-prilocaine (EMLA) cream Apply 1 application topically as needed.  30 g  6  . zoster vaccine live, PF, (ZOSTAVAX) 19400 UNT/0.65ML injection Inject 19,400 Units into the skin once.  1 vial  0   No current facility-administered medications for this visit.    PHYSICAL EXAMINATION: ECOG PERFORMANCE STATUS: 1 - Symptomatic but completely ambulatory  Filed Vitals:   02/27/14 1138  BP: 120/68  Pulse: 64  Temp: 98.7 F (37.1 C)  Resp: 17   Filed Weights     02/27/14 1138  Weight: 175 lb 9.6 oz (79.652 kg)    GENERAL:alert, no distress and comfortable SKIN: skin color, texture, turgor are normal, no rashes or significant lesions EYES: normal, Conjunctiva are pink and non-injected, sclera clear OROPHARYNX:no exudate, no erythema and lips,  buccal mucosa, and tongue normal  NECK: Persistent neck mass, unchanged compared to prior exam. Musculoskeletal:no cyanosis of digits and no clubbing  NEURO: alert & oriented x 3 with fluent speech, no focal motor/sensory deficits  LABORATORY DATA:  I have reviewed the data as listed    Component Value Date/Time   NA 135 02/02/2014 0959   K 4.4 02/02/2014 0959   CL 102 02/02/2014 0959   CO2 26 02/02/2014 0959   GLUCOSE 105* 02/02/2014 0959   GLUCOSE 101* 05/28/2006 0830   BUN 10 02/02/2014 0959   CREATININE 0.9 02/02/2014 0959   CREATININE 0.95 05/13/2013 0953   CALCIUM 9.0 02/02/2014 0959   PROT 6.2 02/02/2014 0959   ALBUMIN 3.7 02/02/2014 0959   AST 26 02/02/2014 0959   ALT 12 02/02/2014 0959   ALKPHOS 77 02/02/2014 0959   BILITOT 1.2 02/02/2014 0959   GFRNONAA >60 01/22/2009 0535   GFRAA  Value: >60        The eGFR has been calculated using the MDRD equation. This calculation has not been validated in all clinical situations. eGFR's persistently <60 mL/min signify possible Chronic Kidney Disease. 01/22/2009 0535    No results found for this basename: SPEP,  UPEP,   kappa and lambda light chains    Lab Results  Component Value Date   WBC 8.5 02/02/2014   NEUTROABS 6.3 02/02/2014   HGB 13.2 02/02/2014   HCT 39.8 02/02/2014   MCV 89.2 02/02/2014   PLT 184.0 02/02/2014      Chemistry      Component Value Date/Time   NA 135 02/02/2014 0959   K 4.4 02/02/2014 0959   CL 102 02/02/2014 0959   CO2 26 02/02/2014 0959   BUN 10 02/02/2014 0959   CREATININE 0.9 02/02/2014 0959   CREATININE 0.95 05/13/2013 0953      Component Value Date/Time   CALCIUM 9.0 02/02/2014 0959   ALKPHOS 77 02/02/2014 0959   AST 26 02/02/2014 0959   ALT 12 02/02/2014 0959   BILITOT 1.2 02/02/2014 0959       RADIOGRAPHIC STUDIES: I reviewed the PET/CT scan with him and wife. I have personally reviewed the radiological images as listed and agreed with the findings in the report.   ASSESSMENT & PLAN:  Metastasis to lymph nodes Clinically, he  appears to have multiple hypermetabolic abnormalities within his large intestine. Colonoscopy is pending. Overall, the patient appears two have primary cancer diagnosis with possible metastatic colon cancer and metastatic squamous cell carcinoma.  If we can obtain tissue diagnosis colonoscopy, I could treat him in a palliative fashion using systemic chemotherapy that may also target his squamous cell carcinoma at the neck. I discussed with him in great detail the use of systemic chemotherapy. He would need chemotherapy education class and Infuse-a-Port placement. He agreed to proceed with plan of care.  TOBACCO ABUSE I spent some time counseling the patient the importance of tobacco cessation. he is currently attempting to quit on his own  I gave him patient education handout and encouraged him to sign up for smoking cessation class.   Neck pain We discussed pain management. I refilled his pain prescription. We discussed about narcotic refill policy.      Orders  Placed This Encounter  Procedures  . IR Fluoro Guide CV Line Right    Indicate type of CVC ordering    Standing Status: Future     Number of Occurrences:      Standing Expiration Date: 04/30/2015    Order Specific Question:  Reason for exam:    Answer:  need port for chemo    Order Specific Question:  Preferred Imaging Location?    Answer:  [facility]  . CBC with Differential    Standing Status: Standing     Number of Occurrences: 9     Standing Expiration Date: 02/28/2015  . Comprehensive metabolic panel    Standing Status: Standing     Number of Occurrences: 9     Standing Expiration Date: 02/28/2015   All questions were answered. The patient knows to call the clinic with any problems, questions or concerns. No barriers to learning was detected. I spent 30 minutes counseling the patient face to face. The total time spent in the appointment was 40 minutes and more than 50% was on counseling and review of test  results     [facility], [place], MD 03/01/2014 9:55 PM

## 2014-03-01 NOTE — Assessment & Plan Note (Signed)
We discussed pain management. I refilled his pain prescription. We discussed about narcotic refill policy.

## 2014-03-02 ENCOUNTER — Ambulatory Visit (AMBULATORY_SURGERY_CENTER): Payer: BC Managed Care – PPO | Admitting: Internal Medicine

## 2014-03-02 ENCOUNTER — Encounter: Payer: Self-pay | Admitting: Internal Medicine

## 2014-03-02 VITALS — BP 143/81 | HR 67 | Temp 99.0°F | Resp 16 | Ht 66.5 in | Wt 176.0 lb

## 2014-03-02 DIAGNOSIS — K6389 Other specified diseases of intestine: Secondary | ICD-10-CM

## 2014-03-02 DIAGNOSIS — D128 Benign neoplasm of rectum: Secondary | ICD-10-CM

## 2014-03-02 DIAGNOSIS — D126 Benign neoplasm of colon, unspecified: Secondary | ICD-10-CM

## 2014-03-02 DIAGNOSIS — D129 Benign neoplasm of anus and anal canal: Secondary | ICD-10-CM

## 2014-03-02 DIAGNOSIS — C187 Malignant neoplasm of sigmoid colon: Secondary | ICD-10-CM

## 2014-03-02 DIAGNOSIS — R933 Abnormal findings on diagnostic imaging of other parts of digestive tract: Secondary | ICD-10-CM

## 2014-03-02 MED ORDER — SODIUM CHLORIDE 0.9 % IV SOLN: 500.0000 mL | INTRAVENOUS | Status: DC

## 2014-03-02 MED ORDER — FLEET ENEMA 7-19 GM/118ML RE ENEM
1.0000 | ENEMA | Freq: Once | RECTAL | Status: AC
  Administered 2014-03-02: 1 via RECTAL

## 2014-03-02 NOTE — Progress Notes (Signed)
Results from enema cloudy liquid.

## 2014-03-02 NOTE — Patient Instructions (Signed)
Colon polyps removed and biopsies taken. Hemorrhoids seen also. Handouts given. Hold aspirin, aspirin containing products, and anti-inflammatory medications for atleast 2 weeks. Oncology follow-up as scheduled.   YOU HAD AN ENDOSCOPIC PROCEDURE TODAY AT [facility]: Refer to the procedure report that was given to you for any specific questions about what was found during the examination.  If the procedure report does not answer your questions, please call your gastroenterologist to clarify.  If you requested that your care partner not be given the details of your procedure findings, then the procedure report has been included in a sealed envelope for you to review at your convenience later.  YOU SHOULD EXPECT: Some feelings of bloating in the abdomen. Passage of more gas than usual.  Walking can help get rid of the air that was put into your GI tract during the procedure and reduce the bloating. If you had a lower endoscopy (such as a colonoscopy or flexible sigmoidoscopy) you may notice spotting of blood in your stool or on the toilet paper. If you underwent a bowel prep for your procedure, then you may not have a normal bowel movement for a few days.  DIET: Your first meal following the procedure should be a light meal and then it is ok to progress to your normal diet.  A half-sandwich or bowl of soup is an example of a good first meal.  Heavy or fried foods are harder to digest and may make you feel nauseous or bloated.  Likewise meals heavy in dairy and vegetables can cause extra gas to form and this can also increase the bloating.  Drink plenty of fluids but you should avoid alcoholic beverages for 24 hours.  ACTIVITY: Your care partner should take you home directly after the procedure.  You should plan to take it easy, moving slowly for the rest of the day.  You can resume normal activity the day after the procedure however you should NOT DRIVE or use heavy machinery for 24 hours  (because of the sedation medicines used during the test).    SYMPTOMS TO REPORT IMMEDIATELY: A gastroenterologist can be reached at any hour.  During normal business hours, 8:30 AM to 5:00 PM Monday through Friday, call [phone].  After hours and on weekends, please call the GI answering service at [phone] who will take a message and have the physician on call contact you.   Following lower endoscopy (colonoscopy or flexible sigmoidoscopy):  Excessive amounts of blood in the stool  Significant tenderness or worsening of abdominal pains  Swelling of the abdomen that is new, acute  Fever of 100F or higher  Following upper endoscopy (EGD)  Vomiting of blood or coffee ground material  New chest pain or pain under the shoulder blades  Painful or persistently difficult swallowing  New shortness of breath  Fever of 100F or higher  Black, tarry-looking stools  FOLLOW UP: If any biopsies were taken you will be contacted by phone or by letter within the next 1-3 weeks.  Call your gastroenterologist if you have not heard about the biopsies in 3 weeks.  Our staff will call the home number listed on your records the next business day following your procedure to check on you and address any questions or concerns that you may have at that time regarding the information given to you following your procedure. This is a courtesy call and so if there is no answer at the home number and we have not  heard from you through the emergency physician on call, we will assume that you have returned to your regular daily activities without incident.  SIGNATURES/CONFIDENTIALITY: You and/or your care partner have signed paperwork which will be entered into your electronic medical record.  These signatures attest to the fact that that the information above on your After Visit Summary has been reviewed and is understood.  Full responsibility of the confidentiality of this discharge information lies with you  and/or your care-partner.

## 2014-03-02 NOTE — Progress Notes (Signed)
Called to room to assist during endoscopic procedure.  Patient ID and intended procedure confirmed with present staff. Received instructions for my participation in the procedure from the performing physician.  

## 2014-03-02 NOTE — Progress Notes (Signed)
A/ox3 pleased with MAC, report to Sheila RN 

## 2014-03-02 NOTE — Op Note (Addendum)
[facility]  [address] [place], [id number]   COLONOSCOPY PROCEDURE REPORT  PATIENT: Jared, Garrett  MR#: [id number] BIRTHDATE: October 28, 1950 , 45  yrs. old GENDER: Male ENDOSCOPIST: Jerene Bears, MD REFERRED BY: Heath Lark, MD PROCEDURE DATE:  03/02/2014 PROCEDURE:   Colonoscopy with snare polypectomy, Colonoscopy with biopsy, and Submucosal injection, any substance First Screening Colonoscopy - Avg.  risk and is 50 yrs.  old or older - No.  Prior Negative Screening - Now for repeat screening. N/A  History of Adenoma - Now for follow-up colonoscopy & has been > or = to 3 yrs.  N/A  Polyps Removed Today? Yes. 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 MEDICATIONS: MAC sedation, administered by CRNA and propofol (Diprivan) 700mg  IV  DESCRIPTION OF PROCEDURE:   After the risks benefits and alternatives of the procedure were thoroughly explained, informed consent was obtained.  A digital rectal exam revealed a skin tag. The LB [id number]  endoscope was introduced through the anus and advanced to the cecum, which was identified by both the appendix and ileocecal valve. No adverse events experienced.   The quality of the prep was good, using MoviPrep  The instrument was then slowly withdrawn as the colon was fully examined.   COLON FINDINGS: A circumferential firm, fungating and ulcerated mass with friable surfaces was found in the sigmoid colon.  Multiple biopsies of the lesion were performed using cold forceps. This lesion begins approximately 30 cm from the dentate line and is approximately 5 cm in length.  It causes luminal narrowing but was able to be traversed with the pediatric colonoscope.  A large pedunculated polyp measuring 3-4 cm in size was found in the proximal sigmoid colon. This lesion is just proximal to the sigmoid mass. Multiple biopsies were  performed using cold forceps. Tattoo with Niger Ink was placed proximal to the pedunculated proximal sigmoid polyp and distal to the sigmoid mass, 6 cc total. A pedunculated polyp measuring 1-2 cm in size was found in the distal sigmoid colon, well below the sigmoid mass.  A polypectomy was performed using snare cautery.  The resection was complete and the polyp tissue was completely retrieved.   Six sessile polyps measuring 5-10 mm in size were found at the cecum (2), in the ascending colon (1), transverse colon, at the splenic flexure, and in the descending colon.  Polypectomy was performed using cold snare (5)and using hot snare (1, splenic flexure).  All resections were complete and all polyp tissue was completely retrieved.   A pedunculated polyp versus skin tag ranging between 6-37mm in size was found in the anal canal.  A biopsy of the lesion was performed using cold forceps.   Moderate sized internal hemorrhoids were found.  Retroflexed views revealed the previously stated lesion and internal hemorrhoids. The time to cecum=6 minutes 45 seconds. Withdrawal time=35 minutes 11 seconds.  The scope was withdrawn and the procedure completed.  COMPLICATIONS: There were no complications.  ENDOSCOPIC IMPRESSION: 1.   Circumferential mass were found in the sigmoid colon; multiple biopsies of the lesion were performed 2.   Pedunculated polyp measuring 3-4 cm in size was found in the proximal sigmoid colon; multiple biopsies were performed using cold forceps. Tattoo applied proximal to this polyp and distal to the sigmoid mass. 3.   Pedunculated polyp measuring 1-2 cm in size was found in the distal sigmoid colon; polypectomy was performed using snare  cautery  4.   Six sessile polyps measuring 5-10 mm in size were found at the cecum, in the ascending colon, transverse colon, at the splenic flexure, and in the descending colon; Polypectomy was performed using cold snare and using hot  snare 5.   Pedunculated polyp of anal canal (vs. skin tag) ranging between 6-35mm in size was found; biopsy of the lesion was performed using cold forceps 6.   Moderate sized internal hemorrhoids  RECOMMENDATIONS: 1.  Hold aspirin, aspirin products, and anti-inflammatory medication for at least 2 weeks. 2.  Await pathology results 3.  Oncology follow-up as scheduled   eSigned:  Jerene Bears, MD 03/02/2014 1:05 PM Revised: 03/02/2014 1:05 PM  cc: The Patient and Ricardo Jericho, MD; Heath Lark, MD  PATIENT NAME:  Jared, Garrett MR#: [id number]

## 2014-03-03 ENCOUNTER — Other Ambulatory Visit: Payer: Self-pay | Admitting: Radiology

## 2014-03-03 ENCOUNTER — Ambulatory Visit: Payer: BC Managed Care – PPO | Admitting: Physician Assistant

## 2014-03-03 ENCOUNTER — Encounter (HOSPITAL_COMMUNITY): Payer: Self-pay | Admitting: Pharmacy Technician

## 2014-03-03 ENCOUNTER — Telehealth: Payer: Self-pay | Admitting: *Deleted

## 2014-03-03 NOTE — Telephone Encounter (Signed)
  Follow up Call-  Call back number 03/02/2014 10/07/2012  Post procedure Call Back phone  # [phone] hm  Permission to leave phone message Yes Yes     Patient questions:  Do you have a fever, pain , or abdominal swelling? No. Pain Score  0 *  Have you tolerated food without any problems? Yes.    Have you been able to return to your normal activities? Yes.    Do you have any questions about your discharge instructions: Diet   No. Medications  No. Follow up visit  No.  Do you have questions or concerns about your Care? No.  Actions: * If pain score is 4 or above: No action needed, pain <4.  Pt. Stated he is fine and Thank YOU.

## 2014-03-04 ENCOUNTER — Ambulatory Visit (HOSPITAL_COMMUNITY)
Admission: RE | Admit: 2014-03-04 | Discharge: 2014-03-04 | Disposition: A | Discharge status: Home / Self Care | Payer: BC Managed Care – PPO | Source: Ambulatory Visit | Attending: Hematology and Oncology | Admitting: Hematology and Oncology

## 2014-03-04 ENCOUNTER — Encounter (HOSPITAL_COMMUNITY): Payer: Self-pay

## 2014-03-04 ENCOUNTER — Other Ambulatory Visit: Payer: Self-pay | Admitting: Hematology and Oncology

## 2014-03-04 DIAGNOSIS — C189 Malignant neoplasm of colon, unspecified: Secondary | ICD-10-CM | POA: Diagnosis not present

## 2014-03-04 DIAGNOSIS — Z79899 Other long term (current) drug therapy: Secondary | ICD-10-CM | POA: Insufficient documentation

## 2014-03-04 DIAGNOSIS — N529 Male erectile dysfunction, unspecified: Secondary | ICD-10-CM | POA: Diagnosis not present

## 2014-03-04 DIAGNOSIS — K219 Gastro-esophageal reflux disease without esophagitis: Secondary | ICD-10-CM | POA: Diagnosis not present

## 2014-03-04 DIAGNOSIS — IMO0002 Reserved for concepts with insufficient information to code with codable children: Secondary | ICD-10-CM

## 2014-03-04 DIAGNOSIS — Z87891 Personal history of nicotine dependence: Secondary | ICD-10-CM | POA: Diagnosis not present

## 2014-03-04 DIAGNOSIS — Z8547 Personal history of malignant neoplasm of testis: Secondary | ICD-10-CM | POA: Diagnosis not present

## 2014-03-04 DIAGNOSIS — Z452 Encounter for adjustment and management of vascular access device: Secondary | ICD-10-CM | POA: Diagnosis present

## 2014-03-04 LAB — CBC WITH DIFFERENTIAL/PLATELET
BASOS ABS: 0 10*3/uL (ref 0.0–0.1)
BASOS PCT: 1 % (ref 0–1)
EOS PCT: 5 % (ref 0–5)
Eosinophils Absolute: 0.4 10*3/uL (ref 0.0–0.7)
HCT: 33.4 % — ABNORMAL LOW (ref 39.0–52.0)
Hemoglobin: 11.4 g/dL — ABNORMAL LOW (ref 13.0–17.0)
Lymphocytes Relative: 14 % (ref 12–46)
Lymphs Abs: 1.1 10*3/uL (ref 0.7–4.0)
MCH: 29.2 pg (ref 26.0–34.0)
MCHC: 34.1 g/dL (ref 30.0–36.0)
MCV: 85.6 fL (ref 78.0–100.0)
Monocytes Absolute: 1 10*3/uL (ref 0.1–1.0)
Monocytes Relative: 12 % (ref 3–12)
NEUTROS ABS: 5.6 10*3/uL (ref 1.7–7.7)
Neutrophils Relative %: 68 % (ref 43–77)
PLATELETS: 209 10*3/uL (ref 150–400)
RBC: 3.9 MIL/uL — ABNORMAL LOW (ref 4.22–5.81)
RDW: 15.8 % — AB (ref 11.5–15.5)
WBC: 8.2 10*3/uL (ref 4.0–10.5)

## 2014-03-04 LAB — PROTIME-INR
INR: 1.02 (ref 0.00–1.49)
PROTHROMBIN TIME: 13.4 s (ref 11.6–15.2)

## 2014-03-04 MED ORDER — MIDAZOLAM HCL 2 MG/2ML IJ SOLN
INTRAMUSCULAR | Status: AC
  Filled 2014-03-04: qty 4

## 2014-03-04 MED ORDER — LIDOCAINE HCL 1 % IJ SOLN
INTRAMUSCULAR | Status: AC
  Filled 2014-03-04: qty 20

## 2014-03-04 MED ORDER — SODIUM CHLORIDE 0.9 % IV SOLN
INTRAVENOUS | Status: DC
  Administered 2014-03-04: 500 mL via INTRAVENOUS

## 2014-03-04 MED ORDER — LIDOCAINE-EPINEPHRINE (PF) 2 %-1:200000 IJ SOLN
INTRAMUSCULAR | Status: AC
  Filled 2014-03-04: qty 20

## 2014-03-04 MED ORDER — FENTANYL CITRATE 0.05 MG/ML IJ SOLN
INTRAMUSCULAR | Status: AC
  Filled 2014-03-04: qty 4

## 2014-03-04 MED ORDER — HEPARIN SOD (PORK) LOCK FLUSH 100 UNIT/ML IV SOLN
500.0000 [IU] | Freq: Once | INTRAVENOUS | Status: AC
  Administered 2014-03-04: 500 [IU] via INTRAVENOUS

## 2014-03-04 MED ORDER — CEFAZOLIN SODIUM-DEXTROSE 2-3 GM-% IV SOLR
INTRAVENOUS | Status: AC
  Filled 2014-03-04: qty 50

## 2014-03-04 MED ORDER — CEFAZOLIN SODIUM-DEXTROSE 2-3 GM-% IV SOLR
2.0000 g | Freq: Once | INTRAVENOUS | Status: AC
  Administered 2014-03-04: 2 g via INTRAVENOUS

## 2014-03-04 MED ORDER — MIDAZOLAM HCL 2 MG/2ML IJ SOLN
INTRAMUSCULAR | Status: AC | PRN
  Administered 2014-03-04: 1 mg via INTRAVENOUS
  Administered 2014-03-04: 2 mg via INTRAVENOUS

## 2014-03-04 MED ORDER — FENTANYL CITRATE 0.05 MG/ML IJ SOLN
INTRAMUSCULAR | Status: AC | PRN
  Administered 2014-03-04 (×2): 100 ug via INTRAVENOUS

## 2014-03-04 MED ORDER — HEPARIN SOD (PORK) LOCK FLUSH 100 UNIT/ML IV SOLN
INTRAVENOUS | Status: AC
  Filled 2014-03-04: qty 5

## 2014-03-04 NOTE — H&P (Signed)
Jared Garrett is an 63 y.o. male.   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 He presents today for port a cath placement for chemotherapy.  Past Medical History  Diagnosis Date  . Hypertension   . Low HDL (under 40)   . Degenerative disc disease, lumbar   . Erectile dysfunction   . Tobacco dependence   . Peripheral edema     legs.  07/2012 bilateral venous/art dopplers via ortho were neg for DVT or Hornbaker's cyst but interstitial fluid noted by ultrasonographer  . Chronic low back pain   . 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  . 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 History of testicular cancer 1979    Testicle removed, extensive abd/pelvic lymph lymph node dissection  . Alcoholism     Records show he was on this as early as 12/2007:  he has been completely "dry" since 07/2012.  Marland Kitchen Perennial allergic rhinitis   . GERD (gastroesophageal reflux disease)     +Barretts esophagus on 10/07/12 EGD  . Asymptomatic varicose veins 12/25/2007  . Blepharitis of both eyes 10/30/2012  . Neck pain 02/18/2014  . Colonic mass 02/19/2014  . Cancer 02/06/14    right neck lymph node=squmopus cell  . Anxiety     Past Surgical History  Procedure Laterality Date  . Appendectomy    . Lumbar disc surgery  01/20/2009    Dr.'s Beane and Gioffre (L5/S1 hemilaminectomy)  . Transthoracic echocardiogram  0/93/23    Diastolic dysfunction, mild LAE, otherwise all normal.  . Tonsillectomy    . Esophagogastroduodenoscopy  10/07/12    No varices.  Barretts esophagus+ benign gastric nodule noted.  No dysplasia or malignancy.  . Colonoscopy  2013    per pt report-normal  . Testicular removal    . Lymph node dissection      Family History  Problem  Relation Age of Onset  . Heart disease Father   . Colon cancer Neg Hx   . Esophageal cancer Neg Hx   . Rectal cancer Neg Hx   . Stomach cancer Neg Hx    Social History:  reports that he has quit smoking. His smoking use included Cigarettes. He has a 20 pack-year smoking history. He has never used smokeless tobacco. He reports that he does not drink alcohol or use illicit drugs.  Allergies: No Known Allergies  Current outpatient prescriptions:HYDROmorphone (DILAUDID) 2 MG tablet, Take 1 tablet (2 mg total) by mouth every 4 (four) hours as needed for moderate pain or severe pain., Disp: 60 tablet, Rfl: 0;  Multiple Vitamin (MULTIVITAMIN) tablet, Take 1 tablet by mouth daily., Disp: , Rfl: ;  pantoprazole (PROTONIX) 40 MG tablet, Take 1 tablet (40 mg total) by mouth daily., Disp: 90 tablet, Rfl: 3 propranolol (INDERAL) 40 MG tablet, Take 1 tablet (40 mg total) by mouth 2 (two) times daily., Disp: 180 tablet, Rfl: 3;  sildenafil (VIAGRA) 50 MG tablet, Take 50 mg by mouth daily as needed for erectile dysfunction., Disp: , Rfl:  Current facility-administered medications:0.9 %  sodium chloride infusion, , Intravenous, Continuous, Hedy Jacob, PA-C, Last Rate: 75 mL/hr at 03/04/14 1306, 500 mL at 03/04/14 1306;  ceFAZolin (ANCEF) IVPB 2 g/50 mL  premix, 2 g, Intravenous, Once, Hedy Jacob, PA-C   Results for orders placed during the hospital encounter of 03/04/14 (from the past 48 hour(s))  CBC WITH DIFFERENTIAL     Status: Abnormal   Collection Time    03/04/14 12:55 PM      Result Value Ref Range   WBC 8.2  4.0 - 10.5 K/uL   RBC 3.90 (*) 4.22 - 5.81 MIL/uL   Hemoglobin 11.4 (*) 13.0 - 17.0 g/dL   HCT 33.4 (*) 39.0 - 52.0 %   MCV 85.6  78.0 - 100.0 fL   MCH 29.2  26.0 - 34.0 pg   MCHC 34.1  30.0 - 36.0 g/dL   RDW 15.8 (*) 11.5 - 15.5 %   Platelets 209  150 - 400 K/uL   Neutrophils Relative % 68  43 - 77 %   Neutro Abs 5.6  1.7 - 7.7 K/uL   Lymphocytes Relative 14  12 - 46 %   Lymphs  Abs 1.1  0.7 - 4.0 K/uL   Monocytes Relative 12  3 - 12 %   Monocytes Absolute 1.0  0.1 - 1.0 K/uL   Eosinophils Relative 5  0 - 5 %   Eosinophils Absolute 0.4  0.0 - 0.7 K/uL   Basophils Relative 1  0 - 1 %   Basophils Absolute 0.0  0.0 - 0.1 K/uL  PROTIME-INR     Status: None   Collection Time    03/04/14 12:55 PM      Result Value Ref Range   Prothrombin Time 13.4  11.6 - 15.2 seconds   INR 1.02  0.00 - 1.49   No results found.  Review of Systems  Constitutional: Negative for fever and chills.  Respiratory: Negative for cough, hemoptysis and shortness of breath.   Cardiovascular: Negative for chest pain.  Gastrointestinal: Negative for nausea, vomiting and abdominal pain.  Genitourinary: Negative for hematuria.  Musculoskeletal:       Occ right neck /shoulder pain with "burning " sensation; occ back pain  Neurological: Negative for headaches.  Endo/Heme/Allergies: Does not bruise/bleed easily.    Height 5' 6.5" (1.689 m), weight 176 lb (79.833 kg). Physical Exam  Constitutional: He is oriented to person, place, and time. He appears well-developed and well-nourished.  Neck:  Large firm rt neck mass, sl tender to palpation  Cardiovascular: Normal rate and regular rhythm.   Respiratory: Effort normal and breath sounds normal.  GI: Soft. Bowel sounds are normal. There is no tenderness.  Musculoskeletal: Normal range of motion. He exhibits no edema.  Neurological: He is alert and oriented to person, place, and time.     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 He presents today for port a cath placement for chemotherapy. Details/risks of procedure d/w pt/wife with their understanding and consent.  Jizel Cheeks,D KEVIN 03/04/2014, 1:36 PM

## 2014-03-04 NOTE — Procedures (Signed)
Interventional Radiology Procedure Note  Procedure: Placement of a LEFT IJ approach single lumen PowerPort.  Tip is positioned at the superior cavoatrial junction and catheter is ready for immediate use.  Complications: No immediate Recommendations:  - Ok to shower tomorrow - Do not submerge for 7 days - Routine line care   Signed,  Kia Stavros K. Bayyinah Dukeman, MD [facility]   

## 2014-03-04 NOTE — Discharge Instructions (Signed)
Implanted Port Home Guide °An implanted port is a type of central line that is placed under the skin. Central lines are used to provide IV access when treatment or nutrition needs to be given through a person's veins. Implanted ports are used for long-term IV access. An implanted port may be placed because:  °· You need IV medicine that would be irritating to the small veins in your hands or arms.   °· You need long-term IV medicines, such as antibiotics.   °· You need IV nutrition for a long period.   °· You need frequent blood draws for lab tests.   °· You need dialysis.   °Implanted ports are usually placed in the chest area, but they can also be placed in the upper arm, the abdomen, or the leg. An implanted port has two main parts:  °· Reservoir. The reservoir is round and will appear as a small, raised area under your skin. The reservoir is the part where a needle is inserted to give medicines or draw blood.   °· Catheter. The catheter is a thin, flexible tube that extends from the reservoir. The catheter is placed into a large vein. Medicine that is inserted into the reservoir goes into the catheter and then into the vein.   °HOW WILL I CARE FOR MY INCISION SITE? °Do not get the incision site wet. Bathe or shower as directed by your health care provider.  °HOW IS MY PORT ACCESSED? °Special steps must be taken to access the port:  °· Before the port is accessed, a numbing cream can be placed on the skin. This helps numb the skin over the port site.   °· Your health care provider uses a sterile technique to access the port. °¨ Your health care provider must put on a mask and sterile gloves. °¨ The skin over your port is cleaned carefully with an antiseptic and allowed to dry. °¨ The port is gently pinched between sterile gloves, and a needle is inserted into the port. °· Only "non-coring" port needles should be used to access the port. Once the port is accessed, a blood return should be checked. This helps  ensure that the port is in the vein and is not clogged.   °· If your port needs to remain accessed for a constant infusion, a clear (transparent) bandage will be placed over the needle site. The bandage and needle will need to be changed every week, or as directed by your health care provider.   °· Keep the bandage covering the needle clean and dry. Do not get it wet. Follow your health care provider's instructions on how to take a shower or bath while the port is accessed.   °· If your port does not need to stay accessed, no bandage is needed over the port.   °WHAT IS FLUSHING? °Flushing helps keep the port from getting clogged. Follow your health care provider's instructions on how and when to flush the port. Ports are usually flushed with saline solution or a medicine called heparin. The need for flushing will depend on how the port is used.  °· If the port is used for intermittent medicines or blood draws, the port will need to be flushed:   °¨ After medicines have been given.   °¨ After blood has been drawn.   °¨ As part of routine maintenance.   °· If a constant infusion is running, the port may not need to be flushed.   °HOW LONG WILL MY PORT STAY IMPLANTED? °The port can stay in for as long as your health care   provider thinks it is needed. When it is time for the port to come out, surgery will be done to remove it. The procedure is similar to the one performed when the port was put in.  °WHEN SHOULD I SEEK IMMEDIATE MEDICAL CARE? °When you have an implanted port, you should seek immediate medical care if:  °· You notice a bad smell coming from the incision site.   °· You have swelling, redness, or drainage at the incision site.   °· You have more swelling or pain at the port site or the surrounding area.   °· You have a fever that is not controlled with medicine. °Document Released: 06/19/2005 Document Revised: 04/09/2013 Document Reviewed: 02/24/2013 °ExitCare® Patient Information ©2015 [employer]. This  information is not intended to replace advice given to you by your health care provider. Make sure you discuss any questions you have with your health care provider. ° °Conscious Sedation, Adult, Care After °Refer to this sheet in the next few weeks. These instructions provide you with information on caring for yourself after your procedure. Your health care provider may also give you more specific instructions. Your treatment has been planned according to current medical practices, but problems sometimes occur. Call your health care provider if you have any problems or questions after your procedure. °WHAT TO EXPECT AFTER THE PROCEDURE  °After your procedure: °· You may feel sleepy, clumsy, and have poor balance for several hours. °· Vomiting may occur if you eat too soon after the procedure. °HOME CARE INSTRUCTIONS °· Do not participate in any activities where you could become injured for at least 24 hours. Do not: °¨ Drive. °¨ Swim. °¨ Ride a bicycle. °¨ Operate heavy machinery. °¨ Cook. °¨ Use power tools. °¨ Climb ladders. °¨ Work from a high place. °· Do not make important decisions or sign legal documents until you are improved. °· If you vomit, drink water, juice, or soup when you can drink without vomiting. Make sure you have little or no nausea before eating solid foods. °· Only take over-the-counter or prescription medicines for pain, discomfort, or fever as directed by your health care provider. °· Make sure you and your family fully understand everything about the medicines given to you, including what side effects may occur. °· You should not drink alcohol, take sleeping pills, or take medicines that cause drowsiness for at least 24 hours. °· If you smoke, do not smoke without supervision. °· If you are feeling better, you may resume normal activities 24 hours after you were sedated. °· Keep all appointments with your health care provider. °SEEK MEDICAL CARE IF: °· Your skin is pale or bluish in  color. °· You continue to feel nauseous or vomit. °· Your pain is getting worse and is not helped by medicine. °· You have bleeding or swelling. °· You are still sleepy or feeling clumsy after 24 hours. °SEEK IMMEDIATE MEDICAL CARE IF: °· You develop a rash. °· You have difficulty breathing. °· You develop any type of allergic problem. °· You have a fever. °MAKE SURE YOU: °· Understand these instructions. °· Will watch your condition. °· Will get help right away if you are not doing well or get worse. °Document Released: 04/09/2013 Document Reviewed: 04/09/2013 °ExitCare® Patient Information ©2015 [employer]. This information is not intended to replace advice given to you by your health care provider. Make sure you discuss any questions you have with your health care provider. ° ° °

## 2014-03-05 ENCOUNTER — Other Ambulatory Visit: Payer: BC Managed Care – PPO

## 2014-03-06 ENCOUNTER — Ambulatory Visit (HOSPITAL_BASED_OUTPATIENT_CLINIC_OR_DEPARTMENT_OTHER): Payer: BC Managed Care – PPO | Admitting: Hematology and Oncology

## 2014-03-06 ENCOUNTER — Encounter: Payer: Self-pay | Admitting: *Deleted

## 2014-03-06 ENCOUNTER — Other Ambulatory Visit (HOSPITAL_BASED_OUTPATIENT_CLINIC_OR_DEPARTMENT_OTHER): Payer: BC Managed Care – PPO

## 2014-03-06 ENCOUNTER — Telehealth: Payer: Self-pay | Admitting: Hematology and Oncology

## 2014-03-06 VITALS — BP 116/63 | HR 60 | Temp 98.4°F | Resp 18 | Ht 66.5 in | Wt 179.2 lb

## 2014-03-06 DIAGNOSIS — C772 Secondary and unspecified malignant neoplasm of intra-abdominal lymph nodes: Secondary | ICD-10-CM

## 2014-03-06 DIAGNOSIS — IMO0002 Reserved for concepts with insufficient information to code with codable children: Secondary | ICD-10-CM

## 2014-03-06 DIAGNOSIS — Z299 Encounter for prophylactic measures, unspecified: Secondary | ICD-10-CM

## 2014-03-06 DIAGNOSIS — C760 Malignant neoplasm of head, face and neck: Secondary | ICD-10-CM

## 2014-03-06 DIAGNOSIS — Z23 Encounter for immunization: Secondary | ICD-10-CM

## 2014-03-06 DIAGNOSIS — M542 Cervicalgia: Secondary | ICD-10-CM

## 2014-03-06 DIAGNOSIS — C189 Malignant neoplasm of colon, unspecified: Secondary | ICD-10-CM | POA: Insufficient documentation

## 2014-03-06 LAB — CBC WITH DIFFERENTIAL/PLATELET
BASO%: 1 % (ref 0.0–2.0)
Basophils Absolute: 0.1 10*3/uL (ref 0.0–0.1)
EOS ABS: 0.4 10*3/uL (ref 0.0–0.5)
EOS%: 4.7 % (ref 0.0–7.0)
HCT: 34.9 % — ABNORMAL LOW (ref 38.4–49.9)
HGB: 11.3 g/dL — ABNORMAL LOW (ref 13.0–17.1)
LYMPH%: 12 % — ABNORMAL LOW (ref 14.0–49.0)
MCH: 29 pg (ref 27.2–33.4)
MCHC: 32.4 g/dL (ref 32.0–36.0)
MCV: 89.4 fL (ref 79.3–98.0)
MONO#: 1.1 10*3/uL — ABNORMAL HIGH (ref 0.1–0.9)
MONO%: 14.3 % — ABNORMAL HIGH (ref 0.0–14.0)
NEUT%: 68 % (ref 39.0–75.0)
NEUTROS ABS: 5.4 10*3/uL (ref 1.5–6.5)
Platelets: 192 10*3/uL (ref 140–400)
RBC: 3.91 10*6/uL — AB (ref 4.20–5.82)
RDW: 16.2 % — AB (ref 11.0–14.6)
WBC: 7.9 10*3/uL (ref 4.0–10.3)
lymph#: 1 10*3/uL (ref 0.9–3.3)

## 2014-03-06 LAB — COMPREHENSIVE METABOLIC PANEL (CC13)
ALBUMIN: 3.4 g/dL — AB (ref 3.5–5.0)
ALK PHOS: 71 U/L (ref 40–150)
ALT: 12 U/L (ref 0–55)
AST: 22 U/L (ref 5–34)
Anion Gap: 5 mEq/L (ref 3–11)
BUN: 13.5 mg/dL (ref 7.0–26.0)
CO2: 26 mEq/L (ref 22–29)
Calcium: 8.8 mg/dL (ref 8.4–10.4)
Chloride: 104 mEq/L (ref 98–109)
Creatinine: 0.9 mg/dL (ref 0.7–1.3)
GLUCOSE: 97 mg/dL (ref 70–140)
POTASSIUM: 4.3 meq/L (ref 3.5–5.1)
SODIUM: 136 meq/L (ref 136–145)
Total Bilirubin: 0.74 mg/dL (ref 0.20–1.20)
Total Protein: 6.3 g/dL — ABNORMAL LOW (ref 6.4–8.3)

## 2014-03-06 MED ORDER — INFLUENZA VAC SPLIT QUAD 0.5 ML IM SUSY
0.5000 mL | PREFILLED_SYRINGE | Freq: Once | INTRAMUSCULAR | Status: AC
  Administered 2014-03-06: 0.5 mL via INTRAMUSCULAR
  Filled 2014-03-06: qty 0.5

## 2014-03-06 NOTE — Progress Notes (Signed)
To provide support and encouragement, care continuity and to assess for needs, met with patient and his wife during [place] appt with Dr. Alvy Bimler.  She discussed results of this week's colonoscopy and identification of adenocarcinomas.    Dr. Alvy Bimler explained this diagnosis along with evidence of lung mets indicates his cancers are not curable, that the chemo she provides can provide control only.  I stayed with them at conclusion of appt, reviewed new information, provided emotional support.  I introduced and explained concept of palliative care, suggested that in the near future a consult with a member of the Palliative Care Team would be supportive.  They expressed understanding.  Gayleen Orem, RN, BSN, [place] at [place] [phone]

## 2014-03-06 NOTE — Telephone Encounter (Signed)
gv and printed appt sched and avs for pt for Sept.....sed added tx °

## 2014-03-07 ENCOUNTER — Encounter: Payer: Self-pay | Admitting: Hematology and Oncology

## 2014-03-07 DIAGNOSIS — Z299 Encounter for prophylactic measures, unspecified: Secondary | ICD-10-CM | POA: Insufficient documentation

## 2014-03-07 NOTE — Assessment & Plan Note (Signed)
We discussed pain management. We discussed about narcotic refill policy.

## 2014-03-07 NOTE — Assessment & Plan Note (Addendum)
We discussed the role of chemotherapy. The intent is for palliative.  We discussed some of the risks, benefits, side-effects of FOLFOX. Some of the short term side-effects included, though not limited to, including weight loss, life threatening infections, risk of allergic reactions, need for transfusions of blood products, nausea, vomiting, change in bowel habits, admission to hospital for various reasons, and risks of death.   Long term side-effects are also discussed including risks of infertility, permanent damage to nerve function, hearing loss, chronic fatigue, kidney damage with possibility needing hemodialysis, and rare secondary malignancy including bone marrow disorders.  The patient is aware that the response rates discussed earlier is not guaranteed.  After a long discussion, patient made an informed decision to proceed with the prescribed plan of care. Patient education material was dispensed.  In the future, if he tolerated treatment well, I will consider adding bevacizumab. I will contact the pathology department to additional RAS and BRAF mutation testing Recommend low fiber diet due to risk of bowel obstruction.

## 2014-03-07 NOTE — Progress Notes (Signed)
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 HPV staining was not possible due to scant tissue.   02/19/2014 Imaging PET CT scan showed neck mass, pulmonary metastasis and colonic mass with mesenteric lymph node metastasis   03/02/2014 Procedure He had colonoscopy and biopsy.   03/02/2014 Pathology Results Accession: [id number] confirming adenocarcinoma.   03/04/2014 Procedure He had placement of Infuse-a-Port.    INTERVAL HISTORY: Please see below for problem oriented charting. He is seen to discuss results of tests  REVIEW OF SYSTEMS:   Constitutional: Denies fevers, chills or abnormal weight loss Eyes: Denies blurriness of vision Ears, nose, mouth, throat, and face: Denies mucositis or sore throat Respiratory: Denies cough, dyspnea or wheezes Cardiovascular: Denies palpitation, chest discomfort or lower extremity swelling Gastrointestinal:  Denies nausea, heartburn or change in bowel habits Skin:  Denies abnormal skin rashes Lymphatics: Denies new lymphadenopathy or easy bruising Neurological:Denies numbness, tingling or new weaknesses Behavioral/Psych: Mood is stable, no new changes  All other systems were reviewed with the patient and are negative.  I have reviewed the past medical history, past surgical history, social history and family history with the patient and they are unchanged from previous note.  ALLERGIES:  has No Known Allergies.  MEDICATIONS:  Current Outpatient Prescriptions  Medication Sig Dispense Refill  . HYDROmorphone (DILAUDID) 2 MG tablet Take 1 tablet (2 mg total) by mouth every 4 (four) hours as needed for moderate pain or severe pain.  60 tablet  0  . Multiple Vitamin (MULTIVITAMIN) tablet Take 1 tablet by mouth daily.      . pantoprazole (PROTONIX) 40 MG tablet Take 1 tablet (40 mg total) by mouth daily.  90 tablet  3  . propranolol (INDERAL) 40 MG tablet Take 1 tablet (40 mg total) by mouth 2 (two) times daily.  180 tablet  3  . sildenafil (VIAGRA) 50 MG tablet Take 50 mg by mouth daily as needed for erectile dysfunction.       No current facility-administered medications for this visit.    PHYSICAL EXAMINATION: ECOG PERFORMANCE STATUS: 0 - Asymptomatic  Filed Vitals:   03/06/14 1104  BP: 116/63  Pulse: 60  Temp: 98.4 F (36.9 C)  Resp: 18   Filed Weights   03/06/14 1104  Weight: 179 lb 3.2 oz (81.285 kg)    GENERAL:alert, no distress and comfortable SKIN: skin color, texture, turgor are normal, no rashes or significant lesions. Well-healed surgical scar at the site of port Musculoskeletal:no cyanosis of digits and no clubbing  NEURO: alert & oriented x 3 with  fluent speech, no focal motor/sensory deficits  LABORATORY DATA:  I have reviewed the data as listed    Component Value Date/Time   NA 136 03/06/2014 1050   NA 135 02/02/2014 0959   K 4.3 03/06/2014 1050   K 4.4 02/02/2014 0959   CL 102 02/02/2014 0959   CO2 26 03/06/2014 1050   CO2 26  02/02/2014 0959   GLUCOSE 97 03/06/2014 1050   GLUCOSE 105* 02/02/2014 0959   GLUCOSE 101* 05/28/2006 0830   BUN 13.5 03/06/2014 1050   BUN 10 02/02/2014 0959   CREATININE 0.9 03/06/2014 1050   CREATININE 0.9 02/02/2014 0959   CREATININE 0.95 05/13/2013 0953   CALCIUM 8.8 03/06/2014 1050   CALCIUM 9.0 02/02/2014 0959   PROT 6.3* 03/06/2014 1050   PROT 6.2 02/02/2014 0959   ALBUMIN 3.4* 03/06/2014 1050   ALBUMIN 3.7 02/02/2014 0959   AST 22 03/06/2014 1050   AST 26 02/02/2014 0959   ALT 12 03/06/2014 1050   ALT 12 02/02/2014 0959   ALKPHOS 71 03/06/2014 1050   ALKPHOS 77 02/02/2014 0959   BILITOT 0.74 03/06/2014 1050   BILITOT 1.2 02/02/2014 0959   GFRNONAA >60 01/22/2009 0535   GFRAA  Value: >60        The eGFR has been calculated using the MDRD equation. This calculation has not been validated in all clinical situations. eGFR's persistently <60 mL/min signify possible Chronic Kidney Disease. 01/22/2009 0535    No results found for this basename: SPEP, UPEP,  kappa and lambda light chains    Lab Results  Component Value Date   WBC 7.9 03/06/2014   NEUTROABS 5.4 03/06/2014   HGB 11.3* 03/06/2014   HCT 34.9* 03/06/2014   MCV 89.4 03/06/2014   PLT 192 03/06/2014      Chemistry      Component Value Date/Time   NA 136 03/06/2014 1050   NA 135 02/02/2014 0959   K 4.3 03/06/2014 1050   K 4.4 02/02/2014 0959   CL 102 02/02/2014 0959   CO2 26 03/06/2014 1050   CO2 26 02/02/2014 0959   BUN 13.5 03/06/2014 1050   BUN 10 02/02/2014 0959   CREATININE 0.9 03/06/2014 1050   CREATININE 0.9 02/02/2014 0959   CREATININE 0.95 05/13/2013 0953      Component Value Date/Time   CALCIUM 8.8 03/06/2014 1050   CALCIUM 9.0 02/02/2014 0959   ALKPHOS 71 03/06/2014 1050   ALKPHOS 77 02/02/2014 0959   AST 22 03/06/2014 1050   AST 26 02/02/2014 0959   ALT 12 03/06/2014 1050   ALT 12 02/02/2014 0959   BILITOT 0.74 03/06/2014 1050   BILITOT 1.2 02/02/2014 0959       RADIOGRAPHIC STUDIES: I. reviewed the results of biopsy and PET scan with him and wife I have personally  reviewed the radiological images as listed and agreed with the findings in the report.  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 The intent is for palliative.  We discussed some of the risks, benefits, side-effects of FOLFOX. Some of the short term side-effects included, though not limited to, including weight loss, life threatening infections, risk of allergic reactions, need for transfusions of blood products, nausea, vomiting, change in bowel habits, admission to hospital for various reasons, and risks of death.   Long term side-effects are also discussed including risks of infertility, permanent damage to nerve function, hearing loss, chronic fatigue, kidney damage with possibility needing hemodialysis,  and rare secondary malignancy including bone marrow disorders.  The patient is aware that the response rates discussed earlier is not guaranteed.  After a long discussion, patient made an informed decision to proceed with the prescribed plan of care. Patient education material was dispensed.  In the future, if he tolerated treatment well, I will consider adding bevacizumab. I will contact the pathology department to additional RAS and BRAF mutation testing Recommend low fiber diet due to risk of bowel obstruction.  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 Hopefully the current treatment prescribed for colon cancer would treat the head and neck cancer as well.  Neck pain We discussed pain management. We discussed about narcotic refill policy.    Preventive measure We discussed the importance of preventive care and reviewed the vaccination programs. He does not have any prior allergic reactions to influenza vaccination. He agrees to proceed with influenza vaccination today and we will administer it today at the clinic.   All questions were  answered. The patient knows to call the clinic with any problems, questions or concerns. No barriers to learning was detected. I spent 40 minutes counseling the patient face to face. The total time spent in the appointment was 60 minutes and more than 50% was on counseling and review of test results     [facility], [place], MD 03/07/2014 4:29 PM

## 2014-03-07 NOTE — Assessment & Plan Note (Signed)
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 Hopefully the current treatment prescribed for colon cancer would treat the head and neck cancer as well.

## 2014-03-07 NOTE — Assessment & Plan Note (Signed)
We discussed the importance of preventive care and reviewed the vaccination programs. He does not have any prior allergic reactions to influenza vaccination. He agrees to proceed with influenza vaccination today and we will administer it today at the clinic.  

## 2014-03-11 ENCOUNTER — Ambulatory Visit (HOSPITAL_BASED_OUTPATIENT_CLINIC_OR_DEPARTMENT_OTHER): Payer: BC Managed Care – PPO

## 2014-03-11 ENCOUNTER — Ambulatory Visit: Payer: BC Managed Care – PPO | Admitting: Nutrition

## 2014-03-11 ENCOUNTER — Other Ambulatory Visit (HOSPITAL_COMMUNITY): Payer: BC Managed Care – PPO

## 2014-03-11 ENCOUNTER — Encounter: Payer: BC Managed Care – PPO | Admitting: Vascular Surgery

## 2014-03-11 ENCOUNTER — Telehealth: Payer: Self-pay | Admitting: Internal Medicine

## 2014-03-11 ENCOUNTER — Encounter: Payer: Self-pay | Admitting: *Deleted

## 2014-03-11 ENCOUNTER — Other Ambulatory Visit: Payer: Self-pay | Admitting: Hematology and Oncology

## 2014-03-11 VITALS — BP 131/66 | HR 60 | Temp 98.3°F | Resp 18

## 2014-03-11 DIAGNOSIS — C760 Malignant neoplasm of head, face and neck: Secondary | ICD-10-CM

## 2014-03-11 DIAGNOSIS — IMO0002 Reserved for concepts with insufficient information to code with codable children: Secondary | ICD-10-CM

## 2014-03-11 DIAGNOSIS — Z5111 Encounter for antineoplastic chemotherapy: Secondary | ICD-10-CM

## 2014-03-11 DIAGNOSIS — C189 Malignant neoplasm of colon, unspecified: Secondary | ICD-10-CM

## 2014-03-11 DIAGNOSIS — C772 Secondary and unspecified malignant neoplasm of intra-abdominal lymph nodes: Secondary | ICD-10-CM

## 2014-03-11 MED ORDER — HYDROMORPHONE HCL 2 MG PO TABS: 2.0000 mg | ORAL_TABLET | ORAL | Status: DC | PRN

## 2014-03-11 MED ORDER — OXALIPLATIN CHEMO INJECTION 100 MG/20ML
85.0000 mg/m2 | Freq: Once | INTRAVENOUS | Status: AC
  Administered 2014-03-11: 165 mg via INTRAVENOUS
  Filled 2014-03-11: qty 33

## 2014-03-11 MED ORDER — PROCHLORPERAZINE MALEATE 10 MG PO TABS: 10.0000 mg | ORAL_TABLET | Freq: Four times a day (QID) | ORAL | Status: AC | PRN

## 2014-03-11 MED ORDER — ONDANSETRON 8 MG/NS 50 ML IVPB
INTRAVENOUS | Status: AC
  Filled 2014-03-11: qty 8

## 2014-03-11 MED ORDER — ONDANSETRON 8 MG/50ML IVPB (CHCC)
8.0000 mg | Freq: Once | INTRAVENOUS | Status: AC
  Administered 2014-03-11: 8 mg via INTRAVENOUS

## 2014-03-11 MED ORDER — DEXAMETHASONE SODIUM PHOSPHATE 10 MG/ML IJ SOLN
10.0000 mg | Freq: Once | INTRAMUSCULAR | Status: AC
  Administered 2014-03-11: 10 mg via INTRAVENOUS

## 2014-03-11 MED ORDER — DEXTROSE 5 % IV SOLN
Freq: Once | INTRAVENOUS | Status: AC
  Administered 2014-03-11: 11:00:00 via INTRAVENOUS

## 2014-03-11 MED ORDER — DEXTROSE 5 % IV SOLN
400.0000 mg/m2 | Freq: Once | INTRAVENOUS | Status: AC
  Administered 2014-03-11: 780 mg via INTRAVENOUS
  Filled 2014-03-11: qty 39

## 2014-03-11 MED ORDER — ONDANSETRON HCL 8 MG PO TABS: 8.0000 mg | ORAL_TABLET | Freq: Three times a day (TID) | ORAL | Status: AC | PRN

## 2014-03-11 MED ORDER — DEXAMETHASONE SODIUM PHOSPHATE 10 MG/ML IJ SOLN
INTRAMUSCULAR | Status: AC
  Filled 2014-03-11: qty 1

## 2014-03-11 MED ORDER — SODIUM CHLORIDE 0.9 % IV SOLN
2400.0000 mg/m2 | INTRAVENOUS | Status: DC
  Administered 2014-03-11: 4700 mg via INTRAVENOUS
  Filled 2014-03-11: qty 94

## 2014-03-11 NOTE — Progress Notes (Signed)
To provide support and encouragement, care continuity and to assess for needs, met with patient and his wife during his first chemo infusion.  Patient expressed extreme confidence in Dr. Calton Dach plan of care.  He also expressed "for my and my family's peace of mind" his interest in obtaining a second opinion re: his prognosis and treatment plan.   I encouraged to do what he felt best in context of the treatment plan underway.  Gayleen Orem, RN, BSN, [place] at [place] [phone]

## 2014-03-11 NOTE — Progress Notes (Signed)
63 year old male diagnosed with right neck cancer and colon cancer.  He is a patient of Dr. Alvy Bimler.  Past medical history includes hypertension, tobacco, degenerative disc disease, hearing impairment, alcoholic cirrhosis, liver with ascites, alcoholism, and testicular cancer.  Medications include MVI and Protonix.  Labs include albumin 3.4.  Height: 66.5 inches. Weight: 179.2 pounds September 4. Usual body weight: 166 pounds May 2015. BMI: 28.49.  Patient reports he has been told to try to gain weight.  Weight is up about 13 pounds from usual body weight.  States he drinks diet Cheerwine most of the day.  He has had a history of constipation, but this is improved with Senokot.  Patient has a lot of anxiety regarding possible side effects from his chemotherapy.  Nutrition diagnosis: Food and nutrition related knowledge deficit related to new diagnosis of right neck cancer and colon cancer as evidenced by no prior need for nutrition related information.  Intervention: Educated patient to consume small, frequent meals with high-calorie, high-protein foods to promote weight maintenance. Provided education on nutrition side effects.  Provided patient with fact sheets. Recommended patient substitute water for Cheerwine to reduce caffeine. Questions were answered.  Teach back method was used.  Contact information was provided.  Monitoring, evaluation, goals: Patient will tolerate adequate calories and protein to promote maintenance of usual body weight.  Next visit: Monday, September 28, during chemotherapy.   **Disclaimer: This note was dictated with voice recognition software. Similar sounding words can inadvertently be transcribed and this note may contain transcription errors which may not have been corrected upon publication of note.**

## 2014-03-11 NOTE — Telephone Encounter (Signed)
Patient called and left a message on voicemail in my office I returned his call today to no answer but left a message for him to call me back

## 2014-03-11 NOTE — Patient Instructions (Signed)
[facility] Discharge Instructions for Patients Receiving Chemotherapy  Today you received the following chemotherapy agents fluorouracil, leucovorin, and oxaliplatin.   To help prevent nausea and vomiting after your treatment, we encourage you to take your nausea medication as directed.    If you develop nausea and vomiting that is not controlled by your nausea medication, call the clinic.   BELOW ARE SYMPTOMS THAT SHOULD BE REPORTED IMMEDIATELY:  *FEVER GREATER THAN 100.5 F  *CHILLS WITH OR WITHOUT FEVER  NAUSEA AND VOMITING THAT IS NOT CONTROLLED WITH YOUR NAUSEA MEDICATION  *UNUSUAL SHORTNESS OF BREATH  *UNUSUAL BRUISING OR BLEEDING  TENDERNESS IN MOUTH AND THROAT WITH OR WITHOUT PRESENCE OF ULCERS  *URINARY PROBLEMS  *BOWEL PROBLEMS  UNUSUAL RASH Items with * indicate a potential emergency and should be followed up as soon as possible.  Feel free to call the clinic you have any questions or concerns. The clinic phone number is [phone].  Oxaliplatin Injection What is this medicine? OXALIPLATIN (ox AL i PLA tin) is a chemotherapy drug. It targets fast dividing cells, like cancer cells, and causes these cells to die. This medicine is used to treat cancers of the colon and rectum, and many other cancers. This medicine may be used for other purposes; ask your health care provider or pharmacist if you have questions. COMMON BRAND NAME(S): Eloxatin What should I tell my health care provider before I take this medicine? They need to know if you have any of these conditions: -kidney disease -an unusual or allergic reaction to oxaliplatin, other chemotherapy, other medicines, foods, dyes, or preservatives -pregnant or trying to get pregnant -breast-feeding How should I use this medicine? This drug is given as an infusion into a vein. It is administered in a hospital or clinic by a specially trained health care professional. Talk to your pediatrician  regarding the use of this medicine in children. Special care may be needed. Overdosage: If you think you have taken too much of this medicine contact a poison control center or emergency room at once. NOTE: This medicine is only for you. Do not share this medicine with others. What if I miss a dose? It is important not to miss a dose. Call your doctor or health care professional if you are unable to keep an appointment. What may interact with this medicine? -medicines to increase blood counts like filgrastim, pegfilgrastim, sargramostim -probenecid -some antibiotics like amikacin, gentamicin, neomycin, polymyxin B, streptomycin, tobramycin -zalcitabine Talk to your doctor or health care professional before taking any of these medicines: -acetaminophen -aspirin -ibuprofen -ketoprofen -naproxen This list may not describe all possible interactions. Give your health care provider a list of all the medicines, herbs, non-prescription drugs, or dietary supplements you use. Also tell them if you smoke, drink alcohol, or use illegal drugs. Some items may interact with your medicine. What should I watch for while using this medicine? Your condition will be monitored carefully while you are receiving this medicine. You will need important blood work done while you are taking this medicine. This medicine can make you more sensitive to cold. Do not drink cold drinks or use ice. Cover exposed skin before coming in contact with cold temperatures or cold objects. When out in cold weather wear warm clothing and cover your mouth and nose to warm the air that goes into your lungs. Tell your doctor if you get sensitive to the cold. This drug may make you feel generally unwell. This is not uncommon, as chemotherapy can  affect healthy cells as well as cancer cells. Report any side effects. Continue your course of treatment even though you feel ill unless your doctor tells you to stop. In some cases, you may be given  additional medicines to help with side effects. Follow all directions for their use. Call your doctor or health care professional for advice if you get a fever, chills or sore throat, or other symptoms of a cold or flu. Do not treat yourself. This drug decreases your body's ability to fight infections. Try to avoid being around people who are sick. This medicine may increase your risk to bruise or bleed. Call your doctor or health care professional if you notice any unusual bleeding. Be careful brushing and flossing your teeth or using a toothpick because you may get an infection or bleed more easily. If you have any dental work done, tell your dentist you are receiving this medicine. Avoid taking products that contain aspirin, acetaminophen, ibuprofen, naproxen, or ketoprofen unless instructed by your doctor. These medicines may hide a fever. Do not become pregnant while taking this medicine. Women should inform their doctor if they wish to become pregnant or think they might be pregnant. There is a potential for serious side effects to an unborn child. Talk to your health care professional or pharmacist for more information. Do not breast-feed an infant while taking this medicine. Call your doctor or health care professional if you get diarrhea. Do not treat yourself. What side effects may I notice from receiving this medicine? Side effects that you should report to your doctor or health care professional as soon as possible: -allergic reactions like skin rash, itching or hives, swelling of the face, lips, or tongue -low blood counts - This drug may decrease the number of white blood cells, red blood cells and platelets. You may be at increased risk for infections and bleeding. -signs of infection - fever or chills, cough, sore throat, pain or difficulty passing urine -signs of decreased platelets or bleeding - bruising, pinpoint red spots on the skin, black, tarry stools, nosebleeds -signs of  decreased red blood cells - unusually weak or tired, fainting spells, lightheadedness -breathing problems -chest pain, pressure -cough -diarrhea -jaw tightness -mouth sores -nausea and vomiting -pain, swelling, redness or irritation at the injection site -pain, tingling, numbness in the hands or feet -problems with balance, talking, walking -redness, blistering, peeling or loosening of the skin, including inside the mouth -trouble passing urine or change in the amount of urine Side effects that usually do not require medical attention (report to your doctor or health care professional if they continue or are bothersome): -changes in vision -constipation -hair loss -loss of appetite -metallic taste in the mouth or changes in taste -stomach pain This list may not describe all possible side effects. Call your doctor for medical advice about side effects. You may report side effects to FDA at 1-800-FDA-1088. Where should I keep my medicine? This drug is given in a hospital or clinic and will not be stored at home. NOTE: This sheet is a summary. It may not cover all possible information. If you have questions about this medicine, talk to your doctor, pharmacist, or health care provider.  2015, Elsevier/Gold Standard. (2008-01-14 17:22:47)   Leucovorin injection What is this medicine? LEUCOVORIN (loo koe VOR in) is used to prevent or treat the harmful effects of some medicines. This medicine is used to treat anemia caused by a low amount of folic acid in the body. It is  also used with 5-fluorouracil (5-FU) to treat colon cancer. This medicine may be used for other purposes; ask your health care provider or pharmacist if you have questions. What should I tell my health care provider before I take this medicine? They need to know if you have any of these conditions: -anemia from low levels of vitamin B-12 in the blood -an unusual or allergic reaction to leucovorin, folic acid, other  medicines, foods, dyes, or preservatives -pregnant or trying to get pregnant -breast-feeding How should I use this medicine? This medicine is for injection into a muscle or into a vein. It is given by a health care professional in a hospital or clinic setting. Talk to your pediatrician regarding the use of this medicine in children. Special care may be needed. Overdosage: If you think you have taken too much of this medicine contact a poison control center or emergency room at once. NOTE: This medicine is only for you. Do not share this medicine with others. What if I miss a dose? This does not apply. What may interact with this medicine? -capecitabine -fluorouracil -phenobarbital -phenytoin -primidone -trimethoprim-sulfamethoxazole This list may not describe all possible interactions. Give your health care provider a list of all the medicines, herbs, non-prescription drugs, or dietary supplements you use. Also tell them if you smoke, drink alcohol, or use illegal drugs. Some items may interact with your medicine. What should I watch for while using this medicine? Your condition will be monitored carefully while you are receiving this medicine. This medicine may increase the side effects of 5-fluorouracil, 5-FU. Tell your doctor or health care professional if you have diarrhea or mouth sores that do not get better or that get worse. What side effects may I notice from receiving this medicine? Side effects that you should report to your doctor or health care professional as soon as possible: -allergic reactions like skin rash, itching or hives, swelling of the face, lips, or tongue -breathing problems -fever, infection -mouth sores -unusual bleeding or bruising -unusually weak or tired Side effects that usually do not require medical attention (report to your doctor or health care professional if they continue or are bothersome): -constipation or diarrhea -loss of appetite -nausea,  vomiting This list may not describe all possible side effects. Call your doctor for medical advice about side effects. You may report side effects to FDA at 1-800-FDA-1088. Where should I keep my medicine? This drug is given in a hospital or clinic and will not be stored at home. NOTE: This sheet is a summary. It may not cover all possible information. If you have questions about this medicine, talk to your doctor, pharmacist, or health care provider.  2015, Elsevier/Gold Standard. (2007-12-24 16:50:29)   Fluorouracil, 5-FU injection What is this medicine? FLUOROURACIL, 5-FU (flure oh YOOR a sil) is a chemotherapy drug. It slows the growth of cancer cells. This medicine is used to treat many types of cancer like breast cancer, colon or rectal cancer, pancreatic cancer, and stomach cancer. This medicine may be used for other purposes; ask your health care provider or pharmacist if you have questions. COMMON BRAND NAME(S): Adrucil What should I tell my health care provider before I take this medicine? They need to know if you have any of these conditions: -blood disorders -dihydropyrimidine dehydrogenase (DPD) deficiency -infection (especially a virus infection such as chickenpox, cold sores, or herpes) -kidney disease -liver disease -malnourished, poor nutrition -recent or ongoing radiation therapy -an unusual or allergic reaction to fluorouracil, other chemotherapy, other  medicines, foods, dyes, or preservatives -pregnant or trying to get pregnant -breast-feeding How should I use this medicine? This drug is given as an infusion or injection into a vein. It is administered in a hospital or clinic by a specially trained health care professional. Talk to your pediatrician regarding the use of this medicine in children. Special care may be needed. Overdosage: If you think you have taken too much of this medicine contact a poison control center or emergency room at once. NOTE: This medicine  is only for you. Do not share this medicine with others. What if I miss a dose? It is important not to miss your dose. Call your doctor or health care professional if you are unable to keep an appointment. What may interact with this medicine? -allopurinol -cimetidine -dapsone -digoxin -hydroxyurea -leucovorin -levamisole -medicines for seizures like ethotoin, fosphenytoin, phenytoin -medicines to increase blood counts like filgrastim, pegfilgrastim, sargramostim -medicines that treat or prevent blood clots like warfarin, enoxaparin, and dalteparin -methotrexate -metronidazole -pyrimethamine -some other chemotherapy drugs like busulfan, cisplatin, estramustine, vinblastine -trimethoprim -trimetrexate -vaccines Talk to your doctor or health care professional before taking any of these medicines: -acetaminophen -aspirin -ibuprofen -ketoprofen -naproxen This list may not describe all possible interactions. Give your health care provider a list of all the medicines, herbs, non-prescription drugs, or dietary supplements you use. Also tell them if you smoke, drink alcohol, or use illegal drugs. Some items may interact with your medicine. What should I watch for while using this medicine? Visit your doctor for checks on your progress. This drug may make you feel generally unwell. This is not uncommon, as chemotherapy can affect healthy cells as well as cancer cells. Report any side effects. Continue your course of treatment even though you feel ill unless your doctor tells you to stop. In some cases, you may be given additional medicines to help with side effects. Follow all directions for their use. Call your doctor or health care professional for advice if you get a fever, chills or sore throat, or other symptoms of a cold or flu. Do not treat yourself. This drug decreases your body's ability to fight infections. Try to avoid being around people who are sick. This medicine may increase  your risk to bruise or bleed. Call your doctor or health care professional if you notice any unusual bleeding. Be careful brushing and flossing your teeth or using a toothpick because you may get an infection or bleed more easily. If you have any dental work done, tell your dentist you are receiving this medicine. Avoid taking products that contain aspirin, acetaminophen, ibuprofen, naproxen, or ketoprofen unless instructed by your doctor. These medicines may hide a fever. Do not become pregnant while taking this medicine. Women should inform their doctor if they wish to become pregnant or think they might be pregnant. There is a potential for serious side effects to an unborn child. Talk to your health care professional or pharmacist for more information. Do not breast-feed an infant while taking this medicine. Men should inform their doctor if they wish to father a child. This medicine may lower sperm counts. Do not treat diarrhea with over the counter products. Contact your doctor if you have diarrhea that lasts more than 2 days or if it is severe and watery. This medicine can make you more sensitive to the sun. Keep out of the sun. If you cannot avoid being in the sun, wear protective clothing and use sunscreen. Do not use sun lamps or tanning  beds/booths. What side effects may I notice from receiving this medicine? Side effects that you should report to your doctor or health care professional as soon as possible: -allergic reactions like skin rash, itching or hives, swelling of the face, lips, or tongue -low blood counts - this medicine may decrease the number of white blood cells, red blood cells and platelets. You may be at increased risk for infections and bleeding. -signs of infection - fever or chills, cough, sore throat, pain or difficulty passing urine -signs of decreased platelets or bleeding - bruising, pinpoint red spots on the skin, black, tarry stools, blood in the urine -signs of  decreased red blood cells - unusually weak or tired, fainting spells, lightheadedness -breathing problems -changes in vision -chest pain -mouth sores -nausea and vomiting -pain, swelling, redness at site where injected -pain, tingling, numbness in the hands or feet -redness, swelling, or sores on hands or feet -stomach pain -unusual bleeding Side effects that usually do not require medical attention (report to your doctor or health care professional if they continue or are bothersome): -changes in finger or toe nails -diarrhea -dry or itchy skin -hair loss -headache -loss of appetite -sensitivity of eyes to the light -stomach upset -unusually teary eyes This list may not describe all possible side effects. Call your doctor for medical advice about side effects. You may report side effects to FDA at 1-800-FDA-1088. Where should I keep my medicine? This drug is given in a hospital or clinic and will not be stored at home. NOTE: This sheet is a summary. It may not cover all possible information. If you have questions about this medicine, talk to your doctor, pharmacist, or health care provider.  2015, Elsevier/Gold Standard. (2007-10-23 13:53:16)

## 2014-03-12 ENCOUNTER — Telehealth: Payer: Self-pay | Admitting: *Deleted

## 2014-03-12 NOTE — Telephone Encounter (Signed)
Called Jared Garrett for chemotherapy F/U.  He is resting and "his wife of 74 years Jared Garrett" was very helpful.  Answered questions and reports he is surprisingly doing well.  No problems with the ambulatory pump.  He hung this on the bed post and it was out of their way while sleeping last night.  Denies n/v.  Denies any new side effects or symptoms.  "Bladder is functioning well but he is a little constipated and took a laxative."  Eating and drinking well and I instructed to drink 64 oz minimum daily or at least the day before, of and after treatment.  Denies questions at this time and encouraged to call if needed.  Reviewed how to call after hours in the case of an emergency.

## 2014-03-12 NOTE — Telephone Encounter (Signed)
Message copied by Cherylynn Ridges on Thu Mar 12, 2014  5:04 PM ------      Message from: Kellie Simmering A      Created: Wed Mar 11, 2014 12:20 PM      Regarding: 1st treatment       [facility] Dr. Alvy Bimler             [phone])      [phone] (H) ------

## 2014-03-13 ENCOUNTER — Ambulatory Visit (HOSPITAL_BASED_OUTPATIENT_CLINIC_OR_DEPARTMENT_OTHER): Payer: BC Managed Care – PPO

## 2014-03-13 ENCOUNTER — Telehealth: Payer: Self-pay | Admitting: *Deleted

## 2014-03-13 VITALS — BP 119/63 | HR 66 | Temp 98.7°F | Resp 20

## 2014-03-13 DIAGNOSIS — C772 Secondary and unspecified malignant neoplasm of intra-abdominal lymph nodes: Principal | ICD-10-CM

## 2014-03-13 DIAGNOSIS — C189 Malignant neoplasm of colon, unspecified: Secondary | ICD-10-CM

## 2014-03-13 DIAGNOSIS — Z452 Encounter for adjustment and management of vascular access device: Secondary | ICD-10-CM

## 2014-03-13 MED ORDER — HEPARIN SOD (PORK) LOCK FLUSH 100 UNIT/ML IV SOLN
500.0000 [IU] | Freq: Once | INTRAVENOUS | Status: AC | PRN
  Administered 2014-03-13: 500 [IU]
  Filled 2014-03-13: qty 5

## 2014-03-13 MED ORDER — SODIUM CHLORIDE 0.9 % IJ SOLN
10.0000 mL | INTRAMUSCULAR | Status: DC | PRN
  Administered 2014-03-13: 10 mL
  Filled 2014-03-13: qty 10

## 2014-03-13 NOTE — Telephone Encounter (Signed)
Dr Hilarie Fredrickson,  Did you want any kind of recall on this patient?  Please advise...thanks  Lelan Pons

## 2014-03-13 NOTE — Patient Instructions (Signed)

## 2014-03-18 ENCOUNTER — Encounter: Payer: Self-pay | Admitting: Internal Medicine

## 2014-03-18 NOTE — Telephone Encounter (Signed)
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 He is currently undergoing therapy with Dr. Alvy Bimler, and he does not want to harm this relationship. He reports he would just go more comfortable with a second opinion but does not want to transfer care at this time.  I discussed this by phone with Dr. Alvy Bimler, she understood completely, and we'll contact the patient to arrange referral to [facility] for second opinion

## 2014-03-19 ENCOUNTER — Telehealth: Payer: Self-pay | Admitting: Internal Medicine

## 2014-03-19 ENCOUNTER — Telehealth: Payer: Self-pay | Admitting: *Deleted

## 2014-03-19 NOTE — Telephone Encounter (Signed)
Dr. Alvy Bimler to make referral My recommendation would be Leamon Arnt or [facility]

## 2014-03-19 NOTE — Telephone Encounter (Signed)
Pt states he got a call from Dr. Filiberto Pinks' office and he was told they were going to make the appt for him to have a 2nd opinion at [facility]. Pt was under the impression that Dr. Hilarie Fredrickson was going to make this referral. Pt just wants to make sure. Please advise.

## 2014-03-19 NOTE — Telephone Encounter (Signed)
Spoke with pt and he is aware. 

## 2014-03-19 NOTE — Telephone Encounter (Signed)
Informed pt ok w/ Dr. Alvy Bimler for him to get second opinion from [facility].  Assured pt Dr. Alvy Bimler is not hurt or offended and it is not uncommon to get second opinion.  Informed him we will make referral.  Called [facility] and requested referral to GI Oncologist,  Dr. Mia Creek, per Dr. Alvy Bimler.  Information given and waiting for call back.  Contact [phone].

## 2014-03-19 NOTE — Telephone Encounter (Signed)
Message copied by Cathlean Cower on Thu Mar 19, 2014 10:49 AM ------      Message from: [facility] [place]: Wed Mar 18, 2014  7:24 PM      Regarding: referral to [facility]       I have 20+ patients scheduled tomorrow so I could not call him personally. Please let him know I will not feel sad in anyway and would be happy to refer him to [facility] if his heart desire so.      Please let me know ------

## 2014-03-19 NOTE — Telephone Encounter (Signed)
We had a nice conversation and they are going to refer because they have the info related to his chemotherapy regimen that I cannot provide She plans to refer to MDs that I am familiar with and agree with(as he had previously asked me).

## 2014-03-19 NOTE — Telephone Encounter (Signed)
Pt aware.

## 2014-03-19 NOTE — Telephone Encounter (Signed)
Which physician at [facility]?

## 2014-03-23 ENCOUNTER — Telehealth: Payer: Self-pay | Admitting: Hematology and Oncology

## 2014-03-23 NOTE — Telephone Encounter (Signed)
Pt appt. To see Dr. Pia Mau @ [facility] is 04/02/14@9 :00. Medical records faxed.  Slides will be fedex'ed. Pt aware to hand carry cd to appt.

## 2014-03-25 ENCOUNTER — Inpatient Hospital Stay: Payer: BC Managed Care – PPO

## 2014-03-25 ENCOUNTER — Encounter: Payer: Self-pay | Admitting: *Deleted

## 2014-03-25 NOTE — Progress Notes (Signed)
[place] Psychosocial Distress Screening Clinical Social Work  Clinical Social Work was referred by distress screening protocol.  The patient scored a 8 on the Psychosocial Distress Thermometer which indicates severe distress. Clinical Social Worker contacted patient by phone to assess for distress and other psychosocial needs. Mr. Asper shared he has completed his first cycle of chemotherapy and he is going for a second opinion at [facility] next week.  Mr. Raden shared he feels he is coping adequately and is tolerating the treatment well.  He reports his main concern is "living".  He is identified his spouse as his main support.  CSW briefly shared information on CSW role and support services and encouraged patient to contact CSW with any questions or concerns.  ONCBCN DISTRESS SCREENING 02/25/2014  Screening Type Initial Screening  Elta [place] the number that describes how much distress you have been experiencing in the past week 8  Practical problem type Insurance;Work/school  Family Problem type Partner  Emotional problem type Nervousness/Anxiety;Adjusting to illness;Boredom  Spiritual/Religous concerns type Facing my mortality  Information Concerns Type Lack of info about diagnosis;Lack of info about treatment  Physical Problem type Sleep/insomnia  Physician notified of physical symptoms Yes  Referral to clinical social work Yes    Clinical Social Worker follow up needed: No.  If yes, follow up plan:  Polo Riley, MSW, LCSW, OSW-C Clinical Social Worker [facility] [phone]

## 2014-03-26 ENCOUNTER — Telehealth: Payer: Self-pay | Admitting: *Deleted

## 2014-03-26 DIAGNOSIS — IMO0002 Reserved for concepts with insufficient information to code with codable children: Secondary | ICD-10-CM

## 2014-03-26 MED ORDER — HYDROMORPHONE HCL 2 MG PO TABS: 2.0000 mg | ORAL_TABLET | ORAL | Status: AC | PRN

## 2014-03-26 NOTE — Telephone Encounter (Signed)
Dilaudid rx signed by Dr. Jana Hakim in Dr. Calton Dach absence.  Rx locked in book and ready to pick up.

## 2014-03-26 NOTE — Telephone Encounter (Signed)
Patient called requesting Rx for HYDROmorphone (DILAUDID) 2 MG tablet.  He reports that he is taking 1 tab 3-4/day for pain of 8/10 that resolves to 1-2.  He says he is able to sleep better now because pain is controlled.  He would like to pick up script this afternoon when he picks up imaging at [facility].  Dr. Calton Dach RN notified.  Gayleen Orem, RN, BSN, [place] at [place] [phone]

## 2014-03-27 ENCOUNTER — Telehealth: Payer: Self-pay | Admitting: *Deleted

## 2014-03-27 NOTE — Telephone Encounter (Signed)
Patient called re: Rx he requested yesterday.  I informed it was ready for pick-up at [facility], explained procedure for obtaining it.  He verbalized understanding.  Continuing navigation as L1 patient (new patient).  Gayleen Orem, RN, BSN, [place] at [place] [phone]

## 2014-03-30 ENCOUNTER — Ambulatory Visit (HOSPITAL_BASED_OUTPATIENT_CLINIC_OR_DEPARTMENT_OTHER): Payer: BC Managed Care – PPO

## 2014-03-30 ENCOUNTER — Telehealth: Payer: Self-pay | Admitting: *Deleted

## 2014-03-30 ENCOUNTER — Encounter: Payer: Self-pay | Admitting: *Deleted

## 2014-03-30 ENCOUNTER — Ambulatory Visit (HOSPITAL_BASED_OUTPATIENT_CLINIC_OR_DEPARTMENT_OTHER): Payer: BC Managed Care – PPO | Admitting: Hematology and Oncology

## 2014-03-30 ENCOUNTER — Ambulatory Visit: Payer: BC Managed Care – PPO | Admitting: Nutrition

## 2014-03-30 ENCOUNTER — Other Ambulatory Visit (HOSPITAL_BASED_OUTPATIENT_CLINIC_OR_DEPARTMENT_OTHER): Payer: BC Managed Care – PPO

## 2014-03-30 ENCOUNTER — Encounter: Payer: Self-pay | Admitting: Hematology and Oncology

## 2014-03-30 ENCOUNTER — Telehealth: Payer: Self-pay | Admitting: Hematology and Oncology

## 2014-03-30 VITALS — BP 129/72 | HR 60 | Temp 98.4°F | Resp 18 | Ht 66.0 in | Wt 179.2 lb

## 2014-03-30 DIAGNOSIS — C189 Malignant neoplasm of colon, unspecified: Secondary | ICD-10-CM

## 2014-03-30 DIAGNOSIS — C187 Malignant neoplasm of sigmoid colon: Secondary | ICD-10-CM

## 2014-03-30 DIAGNOSIS — C772 Secondary and unspecified malignant neoplasm of intra-abdominal lymph nodes: Secondary | ICD-10-CM

## 2014-03-30 DIAGNOSIS — IMO0002 Reserved for concepts with insufficient information to code with codable children: Secondary | ICD-10-CM

## 2014-03-30 DIAGNOSIS — Z5111 Encounter for antineoplastic chemotherapy: Secondary | ICD-10-CM

## 2014-03-30 DIAGNOSIS — C4442 Squamous cell carcinoma of skin of scalp and neck: Secondary | ICD-10-CM

## 2014-03-30 DIAGNOSIS — C779 Secondary and unspecified malignant neoplasm of lymph node, unspecified: Secondary | ICD-10-CM

## 2014-03-30 DIAGNOSIS — D630 Anemia in neoplastic disease: Secondary | ICD-10-CM

## 2014-03-30 DIAGNOSIS — C180 Malignant neoplasm of cecum: Secondary | ICD-10-CM

## 2014-03-30 LAB — COMPREHENSIVE METABOLIC PANEL (CC13)
ALK PHOS: 75 U/L (ref 40–150)
ALT: 10 U/L (ref 0–55)
ANION GAP: 6 meq/L (ref 3–11)
AST: 25 U/L (ref 5–34)
Albumin: 3.3 g/dL — ABNORMAL LOW (ref 3.5–5.0)
BUN: 12.7 mg/dL (ref 7.0–26.0)
CO2: 26 meq/L (ref 22–29)
Calcium: 9.4 mg/dL (ref 8.4–10.4)
Chloride: 106 mEq/L (ref 98–109)
Creatinine: 0.8 mg/dL (ref 0.7–1.3)
GLUCOSE: 104 mg/dL (ref 70–140)
POTASSIUM: 4.3 meq/L (ref 3.5–5.1)
SODIUM: 138 meq/L (ref 136–145)
TOTAL PROTEIN: 6.3 g/dL — AB (ref 6.4–8.3)
Total Bilirubin: 0.88 mg/dL (ref 0.20–1.20)

## 2014-03-30 LAB — CBC WITH DIFFERENTIAL/PLATELET
BASO%: 1.2 % (ref 0.0–2.0)
Basophils Absolute: 0.1 10*3/uL (ref 0.0–0.1)
EOS ABS: 0.2 10*3/uL (ref 0.0–0.5)
EOS%: 3.2 % (ref 0.0–7.0)
HCT: 36.4 % — ABNORMAL LOW (ref 38.4–49.9)
HGB: 11.7 g/dL — ABNORMAL LOW (ref 13.0–17.1)
LYMPH%: 12.7 % — ABNORMAL LOW (ref 14.0–49.0)
MCH: 28.1 pg (ref 27.2–33.4)
MCHC: 32.1 g/dL (ref 32.0–36.0)
MCV: 87.5 fL (ref 79.3–98.0)
MONO#: 1.1 10*3/uL — AB (ref 0.1–0.9)
MONO%: 19 % — ABNORMAL HIGH (ref 0.0–14.0)
NEUT%: 63.9 % (ref 39.0–75.0)
NEUTROS ABS: 3.8 10*3/uL (ref 1.5–6.5)
Platelets: 168 10*3/uL (ref 140–400)
RBC: 4.16 10*6/uL — AB (ref 4.20–5.82)
RDW: 15.3 % — AB (ref 11.0–14.6)
WBC: 5.9 10*3/uL (ref 4.0–10.3)
lymph#: 0.8 10*3/uL — ABNORMAL LOW (ref 0.9–3.3)

## 2014-03-30 MED ORDER — DEXTROSE 5 % IV SOLN
Freq: Once | INTRAVENOUS | Status: AC
  Administered 2014-03-30: 13:00:00 via INTRAVENOUS

## 2014-03-30 MED ORDER — SODIUM CHLORIDE 0.9 % IV SOLN
2400.0000 mg/m2 | INTRAVENOUS | Status: DC
  Administered 2014-03-30: 4700 mg via INTRAVENOUS
  Filled 2014-03-30: qty 94

## 2014-03-30 MED ORDER — DEXAMETHASONE SODIUM PHOSPHATE 10 MG/ML IJ SOLN
INTRAMUSCULAR | Status: AC
  Filled 2014-03-30: qty 1

## 2014-03-30 MED ORDER — LEUCOVORIN CALCIUM INJECTION 350 MG
400.0000 mg/m2 | Freq: Once | INTRAVENOUS | Status: AC
  Administered 2014-03-30: 780 mg via INTRAVENOUS
  Filled 2014-03-30: qty 39

## 2014-03-30 MED ORDER — ONDANSETRON 8 MG/50ML IVPB (CHCC)
8.0000 mg | Freq: Once | INTRAVENOUS | Status: AC
  Administered 2014-03-30: 8 mg via INTRAVENOUS

## 2014-03-30 MED ORDER — OXALIPLATIN CHEMO INJECTION 100 MG/20ML
85.0000 mg/m2 | Freq: Once | INTRAVENOUS | Status: AC
  Administered 2014-03-30: 165 mg via INTRAVENOUS
  Filled 2014-03-30: qty 33

## 2014-03-30 MED ORDER — ONDANSETRON 8 MG/NS 50 ML IVPB
INTRAVENOUS | Status: AC
  Filled 2014-03-30: qty 8

## 2014-03-30 MED ORDER — DEXAMETHASONE SODIUM PHOSPHATE 10 MG/ML IJ SOLN
10.0000 mg | Freq: Once | INTRAMUSCULAR | Status: AC
  Administered 2014-03-30: 10 mg via INTRAVENOUS

## 2014-03-30 NOTE — Patient Instructions (Signed)
[facility] Discharge Instructions for Patients Receiving Chemotherapy  Today you received the following chemotherapy agents: Oxaliplatin, Leucovorin, 5FU   To help prevent nausea and vomiting after your treatment, we encourage you to take your nausea medication as prescribed.    If you develop nausea and vomiting that is not controlled by your nausea medication, call the clinic.   BELOW ARE SYMPTOMS THAT SHOULD BE REPORTED IMMEDIATELY:  *FEVER GREATER THAN 100.5 F  *CHILLS WITH OR WITHOUT FEVER  NAUSEA AND VOMITING THAT IS NOT CONTROLLED WITH YOUR NAUSEA MEDICATION  *UNUSUAL SHORTNESS OF BREATH  *UNUSUAL BRUISING OR BLEEDING  TENDERNESS IN MOUTH AND THROAT WITH OR WITHOUT PRESENCE OF ULCERS  *URINARY PROBLEMS  *BOWEL PROBLEMS  UNUSUAL RASH Items with * indicate a potential emergency and should be followed up as soon as possible.  Feel free to call the clinic you have any questions or concerns. The clinic phone number is [phone].    

## 2014-03-30 NOTE — Telephone Encounter (Signed)
Per staff message and POF I have scheduled appts. Advised scheduler of appts. JMW  

## 2014-03-30 NOTE — Progress Notes (Signed)
Nutrition followup completed with patient receiving chemotherapy for right neck cancer and colon cancer. Weight is stable and documented as 179.2 pounds September 20. Patient reports constipation has improved. He has no nutrition side effects.  Nutrition diagnosis: Food and nutrition related knowledge deficit has improved.  Intervention: Patient educated to continue small frequent meals with high-calorie, high-protein foods. Questions were answered.  Teach back method used.  Monitoring, evaluation, goals: Patient will tolerate adequate calories and protein to continue weight maintenance.  Next visit: Monday, October 12, during chemotherapy.  **Disclaimer: This note was dictated with voice recognition software. Similar sounding words can inadvertently be transcribed and this note may contain transcription errors which may not have been corrected upon publication of note.**

## 2014-03-30 NOTE — Telephone Encounter (Signed)
, °

## 2014-03-30 NOTE — Progress Notes (Signed)
To provide support and encouragement, care continuity and to assess for needs, met with patient and his wife during est pt appt with Dr. Alvy Bimler. He reported: 1) he is having regular BMs,  2) has had no 'cold sensations' s/p chemo, and  3) has 2nd opinion appt at [facility] this Thursday.  He emphasized he has no misgivings about the care Dr. Alvy Bimler is providing, he is obtaining 2nd opinion for his peace of mind.    He did not express any needs or concerns at this time, I encouraged him to contact me if that changes before I see him next, he verbalized agreement.  Gayleen Orem, RN, BSN, [place] at [place] [phone]

## 2014-03-30 NOTE — Assessment & Plan Note (Signed)
The patient plans to obtain a second opinion and at the end this week. I encouraged him to do so. I recommend the addition of bevacizumab with cycle 3 of therapy. I discussed with him the risks, benefits and side effects of bevacizumab and he agreed to proceed. In the meantime, he tolerated chemotherapy well without side effects. We will proceed with treatment cycle 2 today without dose adjustment.

## 2014-03-30 NOTE — Progress Notes (Signed)
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 HPV staining was not possible due to scant tissue.   02/19/2014 Imaging PET CT scan showed neck mass, pulmonary metastasis and colonic mass with mesenteric lymph node metastasis   03/02/2014 Procedure He had colonoscopy and biopsy.   03/02/2014 Pathology Results Accession: [id number] confirming adenocarcinoma.   03/04/2014 Procedure He had placement of Infuse-a-Port.    INTERVAL HISTORY: Please see below for problem oriented charting. He is seen prior to cycle 2 of treatment. He tolerated her treatment well without any side effects. He felt that the neck mass is reducing in size.  REVIEW OF SYSTEMS:   Constitutional: Denies fevers, chills or abnormal weight loss Eyes: Denies blurriness of vision Ears, nose, mouth, throat, and face: Denies mucositis or sore throat Respiratory: Denies cough, dyspnea or wheezes Cardiovascular: Denies palpitation, chest  discomfort or lower extremity swelling Gastrointestinal:  Denies nausea, heartburn or change in bowel habits Skin: Denies abnormal skin rashes Lymphatics: Denies new lymphadenopathy or easy bruising Neurological:Denies numbness, tingling or new weaknesses Behavioral/Psych: Mood is stable, no new changes  All other systems were reviewed with the patient and are negative.  I have reviewed the past medical history, past surgical history, social history and family history with the patient and they are unchanged from previous note.  ALLERGIES:  has No Known Allergies.  MEDICATIONS:  Current Outpatient Prescriptions  Medication Sig Dispense Refill  . HYDROmorphone (DILAUDID) 2 MG tablet Take 1 tablet (2 mg total) by mouth every 4 (four) hours as needed for moderate pain or severe pain.  60 tablet  0  . Multiple Vitamin (MULTIVITAMIN) tablet Take 1 tablet by mouth daily.      . pantoprazole (PROTONIX) 40 MG tablet Take 1 tablet (40 mg total) by mouth daily.  90 tablet  3  . prochlorperazine (COMPAZINE) 10 MG tablet Take 1 tablet (10 mg total) by mouth every 6 (six) hours as needed.  30 tablet  3  . propranolol (INDERAL) 40 MG tablet Take 1 tablet (40 mg total) by mouth 2 (two) times daily.  180 tablet  3  . sildenafil (VIAGRA) 50 MG tablet Take 50 mg by mouth daily as needed for erectile dysfunction.      . ondansetron (ZOFRAN) 8 MG tablet Take 1 tablet (8 mg total) by mouth every 8 (eight) hours as needed for nausea.  30 tablet  3   No current facility-administered medications for this visit.    PHYSICAL EXAMINATION: ECOG PERFORMANCE STATUS: 1 - Symptomatic but completely ambulatory  Filed Vitals:  03/30/14 1055  BP: 129/72  Pulse: 60  Temp: 98.4 F (36.9 C)  Resp: 18   Filed Weights   03/30/14 1055  Weight: 179 lb 3.2 oz (81.285 kg)    GENERAL:alert, no distress and comfortable SKIN: skin color, texture, turgor are normal, no rashes or significant lesions EYES: normal,  Conjunctiva are pink and non-injected, sclera clear OROPHARYNX:no exudate, no erythema and lips, buccal mucosa, and tongue normal  NECK: supple, thyroid normal size, non-tender, without nodularity LYMPH:  Persistent mass in the right of his neck, reduced in size  LUNGS: clear to auscultation and percussion with normal breathing effort HEART: regular rate & rhythm and no murmurs and no lower extremity edema ABDOMEN:abdomen soft, non-tender and normal bowel sounds Musculoskeletal:no cyanosis of digits and no clubbing  NEURO: alert & oriented x 3 with fluent speech, no focal motor/sensory deficits  LABORATORY DATA:  I have reviewed the data as listed    Component Value Date/Time   NA 138 03/30/2014 1029   NA 135 02/02/2014 0959   K 4.3 03/30/2014 1029   K 4.4 02/02/2014 0959   CL 102 02/02/2014 0959   CO2 26 03/30/2014 1029   CO2 26 02/02/2014 0959   GLUCOSE 104 03/30/2014 1029   GLUCOSE 105* 02/02/2014 0959   GLUCOSE 101* 05/28/2006 0830   BUN 12.7 03/30/2014 1029   BUN 10 02/02/2014 0959   CREATININE 0.8 03/30/2014 1029   CREATININE 0.9 02/02/2014 0959   CREATININE 0.95 05/13/2013 0953   CALCIUM 9.4 03/30/2014 1029   CALCIUM 9.0 02/02/2014 0959   PROT 6.3* 03/30/2014 1029   PROT 6.2 02/02/2014 0959   ALBUMIN 3.3* 03/30/2014 1029   ALBUMIN 3.7 02/02/2014 0959   AST 25 03/30/2014 1029   AST 26 02/02/2014 0959   ALT 10 03/30/2014 1029   ALT 12 02/02/2014 0959   ALKPHOS 75 03/30/2014 1029   ALKPHOS 77 02/02/2014 0959   BILITOT 0.88 03/30/2014 1029   BILITOT 1.2 02/02/2014 0959   GFRNONAA >60 01/22/2009 0535   GFRAA  Value: >60        The eGFR has been calculated using the MDRD equation. This calculation has not been validated in all clinical situations. eGFR's persistently <60 mL/min signify possible Chronic Kidney Disease. 01/22/2009 0535    No results found for this basename: SPEP, UPEP,  kappa and lambda light chains    Lab Results  Component Value Date   WBC 5.9 03/30/2014   NEUTROABS 3.8 03/30/2014   HGB  11.7* 03/30/2014   HCT 36.4* 03/30/2014   MCV 87.5 03/30/2014   PLT 168 03/30/2014      Chemistry      Component Value Date/Time   NA 138 03/30/2014 1029   NA 135 02/02/2014 0959   K 4.3 03/30/2014 1029   K 4.4 02/02/2014 0959   CL 102 02/02/2014 0959   CO2 26 03/30/2014 1029   CO2 26 02/02/2014 0959   BUN 12.7 03/30/2014 1029   BUN 10 02/02/2014 0959   CREATININE 0.8 03/30/2014 1029   CREATININE 0.9 02/02/2014 0959   CREATININE 0.95 05/13/2013 0953      Component Value Date/Time   CALCIUM 9.4 03/30/2014 1029   CALCIUM 9.0 02/02/2014 0959   ALKPHOS 75 03/30/2014 1029   ALKPHOS 77 02/02/2014 0959   AST 25 03/30/2014 1029   AST 26 02/02/2014 0959   ALT 10 03/30/2014 1029   ALT 12 02/02/2014 0959   BILITOT 0.88 03/30/2014 1029   BILITOT 1.2 02/02/2014 0959  ASSESSMENT & PLAN:  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 I encouraged him to do so. I recommend the addition of bevacizumab with cycle 3 of therapy. I discussed with him the risks, benefits and side effects of bevacizumab and he agreed to proceed. In the meantime, he tolerated chemotherapy well without side effects. We will proceed with treatment cycle 2 today without dose adjustment.  Squamous cell carcinoma The size of the tumor over the right side of his neck has regressed in size. Recommend close monitoring. The chemotherapy regimen should treat this as well.  Anemia in neoplastic disease This is likely due to recent treatment. The patient denies recent history of bleeding such as epistaxis, hematuria or hematochezia. He is asymptomatic from the anemia. I will observe for now.  He does not require transfusion now. I will continue the chemotherapy at current dose without dosage adjustment.  If the anemia gets progressive worse in the future, I might have to delay his treatment or adjust the chemotherapy dose.    Orders Placed This Encounter  Procedures   . CEA    Standing Status: Future     Number of Occurrences:      Standing Expiration Date: 05/04/2015  . UA Protein, Dipstick - [facility]    Standing Status: Standing     Number of Occurrences: 9     Standing Expiration Date: 03/31/2015   All questions were answered. The patient knows to call the clinic with any problems, questions or concerns. No barriers to learning was detected. I spent 30 minutes counseling the patient face to face. The total time spent in the appointment was 40 minutes and more than 50% was on counseling and review of test results     [facility], [place], MD 03/30/2014 9:28 PM

## 2014-03-30 NOTE — Assessment & Plan Note (Signed)
This is likely due to recent treatment. The patient denies recent history of bleeding such as epistaxis, hematuria or hematochezia. He is asymptomatic from the anemia. I will observe for now.  He does not require transfusion now. I will continue the chemotherapy at current dose without dosage adjustment.  If the anemia gets progressive worse in the future, I might have to delay his treatment or adjust the chemotherapy dose.  

## 2014-03-30 NOTE — Assessment & Plan Note (Signed)
The size of the tumor over the right side of his neck has regressed in size. Recommend close monitoring. The chemotherapy regimen should treat this as well.

## 2014-04-01 ENCOUNTER — Ambulatory Visit (HOSPITAL_BASED_OUTPATIENT_CLINIC_OR_DEPARTMENT_OTHER): Payer: BC Managed Care – PPO

## 2014-04-01 DIAGNOSIS — C180 Malignant neoplasm of cecum: Secondary | ICD-10-CM

## 2014-04-01 DIAGNOSIS — C189 Malignant neoplasm of colon, unspecified: Secondary | ICD-10-CM

## 2014-04-01 DIAGNOSIS — C4442 Squamous cell carcinoma of skin of scalp and neck: Secondary | ICD-10-CM

## 2014-04-01 DIAGNOSIS — C772 Secondary and unspecified malignant neoplasm of intra-abdominal lymph nodes: Secondary | ICD-10-CM

## 2014-04-01 MED ORDER — HEPARIN SOD (PORK) LOCK FLUSH 100 UNIT/ML IV SOLN
500.0000 [IU] | Freq: Once | INTRAVENOUS | Status: AC | PRN
  Administered 2014-04-01: 500 [IU]
  Filled 2014-04-01: qty 5

## 2014-04-01 MED ORDER — SODIUM CHLORIDE 0.9 % IJ SOLN
10.0000 mL | INTRAMUSCULAR | Status: DC | PRN
  Administered 2014-04-01: 10 mL
  Filled 2014-04-01: qty 10

## 2014-04-06 ENCOUNTER — Other Ambulatory Visit: Payer: Self-pay | Admitting: Hematology and Oncology

## 2014-04-09 ENCOUNTER — Telehealth: Payer: Self-pay | Admitting: Hematology and Oncology

## 2014-04-09 ENCOUNTER — Other Ambulatory Visit: Payer: Self-pay | Admitting: Hematology and Oncology

## 2014-04-09 NOTE — Telephone Encounter (Signed)
Per rick Md wants appt for tomorrow at 12pm....done

## 2014-04-10 ENCOUNTER — Telehealth: Payer: Self-pay | Admitting: *Deleted

## 2014-04-10 ENCOUNTER — Telehealth: Payer: Self-pay | Admitting: Hematology and Oncology

## 2014-04-10 ENCOUNTER — Other Ambulatory Visit: Payer: Self-pay | Admitting: *Deleted

## 2014-04-10 ENCOUNTER — Ambulatory Visit: Payer: BC Managed Care – PPO | Admitting: Hematology and Oncology

## 2014-04-10 ENCOUNTER — Other Ambulatory Visit: Payer: Self-pay | Admitting: Hematology and Oncology

## 2014-04-10 NOTE — Telephone Encounter (Signed)
Per 10/09 POF, MD added pt aware..... Jared Garrett

## 2014-04-10 NOTE — Telephone Encounter (Signed)
Received a call from Dr. Alvy Bimler at the [facility].  She would like Dr. Hilarie Fredrickson to call her on Monday at [phone]. She has some more information on this patient.

## 2014-04-10 NOTE — Telephone Encounter (Signed)
Changedf appts per Liliane Channel

## 2014-04-10 NOTE — Telephone Encounter (Signed)
Returned patient's VM s/p his consult appts with Dr. Princess Perna and Dr. Shawna Orleans at [facility] this morning.  He reported they recommend 1. radiotherapy for tmt of his neck mass, 2. no further chemotherapy, 3. no surgical option at this time. They further suggested he have further discussion with Dr. Pia Mau when he returns to his office a week from this coming Monday before proceeding with any treatment decisions.  Patient indicated he wishes to proceed with these recommendations.    I presented Dr. Calton Dach offer to speak with him further this Monday @ 11:30.  He agreed.  Gayleen Orem, RN, BSN, [place] at [place] [phone]

## 2014-04-10 NOTE — Telephone Encounter (Signed)
Returned patient's

## 2014-04-13 ENCOUNTER — Ambulatory Visit (HOSPITAL_BASED_OUTPATIENT_CLINIC_OR_DEPARTMENT_OTHER): Payer: BC Managed Care – PPO | Admitting: Hematology and Oncology

## 2014-04-13 ENCOUNTER — Encounter: Payer: BC Managed Care – PPO | Admitting: Nutrition

## 2014-04-13 ENCOUNTER — Encounter: Payer: Self-pay | Admitting: *Deleted

## 2014-04-13 ENCOUNTER — Ambulatory Visit: Payer: BC Managed Care – PPO

## 2014-04-13 ENCOUNTER — Other Ambulatory Visit: Payer: BC Managed Care – PPO

## 2014-04-13 ENCOUNTER — Encounter: Payer: Self-pay | Admitting: Hematology and Oncology

## 2014-04-13 VITALS — BP 121/71 | HR 66 | Temp 99.6°F | Resp 18 | Ht 66.0 in | Wt 174.6 lb

## 2014-04-13 DIAGNOSIS — C189 Malignant neoplasm of colon, unspecified: Secondary | ICD-10-CM

## 2014-04-13 DIAGNOSIS — C779 Secondary and unspecified malignant neoplasm of lymph node, unspecified: Secondary | ICD-10-CM

## 2014-04-13 DIAGNOSIS — C7800 Secondary malignant neoplasm of unspecified lung: Secondary | ICD-10-CM

## 2014-04-13 DIAGNOSIS — C772 Secondary and unspecified malignant neoplasm of intra-abdominal lymph nodes: Secondary | ICD-10-CM

## 2014-04-13 NOTE — Progress Notes (Signed)
To provide continuity of care and support, met with patient during Est Pt appt with Dr. Alvy Bimler.  Patient received clarification re: the role of RT after chemotherapy.  Patient will call Dr. Rueben Bash after his f/u appt at [facility] with Dr. Pia Mau to provide update on their tmt recommendations.  Gayleen Orem, RN, BSN, [place] at [place] [phone]

## 2014-04-13 NOTE — Progress Notes (Signed)
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 HPV staining was not possible due to scant tissue.   02/19/2014 Imaging PET CT scan showed neck mass, pulmonary metastasis and colonic mass with mesenteric lymph node metastasis   03/02/2014 Procedure He had colonoscopy and biopsy.   03/02/2014 Pathology Results Accession: [id number] confirming adenocarcinoma.   03/04/2014 Procedure He had placement of Infuse-a-Port.    INTERVAL HISTORY: Please see below for problem oriented charting. He has returned from recent second opinion from [facility]. He has made plan to discontinue treatment pending further review. In the meantime, his last cycle chemotherapy is uneventful. He has no side effects. The neck mass continued to improve in size and it is not bothering him. It is not causing any pain.   REVIEW OF SYSTEMS:   Constitutional: Denies fevers, chills or abnormal  weight loss Eyes: Denies blurriness of vision Ears, nose, mouth, throat, and face: Denies mucositis or sore throat Respiratory: Denies cough, dyspnea or wheezes Cardiovascular: Denies palpitation, chest discomfort or lower extremity swelling Gastrointestinal:  Denies nausea, heartburn or change in bowel habits Skin: Denies abnormal skin rashes Lymphatics: Denies new lymphadenopathy or easy bruising Neurological:Denies numbness, tingling or new weaknesses Behavioral/Psych: Mood is stable, no new changes  All other systems were reviewed with the patient and are negative.  I have reviewed the past medical history, past surgical history, social history and family history with the patient and they are unchanged from previous note.  ALLERGIES:  has No Known Allergies.  MEDICATIONS:  Current Outpatient Prescriptions  Medication Sig Dispense Refill  . HYDROmorphone (DILAUDID) 2 MG tablet Take 1 tablet (2 mg total) by mouth every 4 (four) hours as needed for moderate pain or severe pain.  60 tablet  0  . Multiple Vitamin (MULTIVITAMIN) tablet Take 1 tablet by mouth daily.      . pantoprazole (PROTONIX) 40 MG tablet Take 1 tablet (40 mg total) by mouth daily.  90 tablet  3  . propranolol (INDERAL) 40 MG tablet Take 1 tablet (40 mg total) by mouth 2 (two) times daily.  180 tablet  3  . sildenafil (VIAGRA) 50 MG tablet Take 50 mg by mouth daily as needed for erectile dysfunction.      . ondansetron (ZOFRAN) 8 MG tablet Take 1 tablet (8 mg total) by mouth every 8 (eight) hours as needed for nausea.  30 tablet  3  . prochlorperazine (COMPAZINE) 10 MG tablet Take 1 tablet (10 mg total) by mouth every 6 (six) hours  as needed.  30 tablet  3   No current facility-administered medications for this visit.    PHYSICAL EXAMINATION: ECOG PERFORMANCE STATUS: 0 - Asymptomatic  Filed Vitals:   04/13/14 1138  BP: 121/71  Pulse: 66  Temp: 99.6 F (37.6 C)  Resp: 18   Filed Weights   04/13/14 1138   Weight: 174 lb 9.6 oz (79.198 kg)    GENERAL:alert, no distress and comfortable SKIN: skin color, texture, turgor are normal, no rashes or significant lesions EYES: normal, Conjunctiva are pink and non-injected, sclera clear OROPHARYNX:no exudate, no erythema and lips, buccal mucosa, and tongue normal  NECK: supple,persistent and large mass in the right the neck, reduced in size.  NEURO: alert & oriented x 3 with fluent speech, no focal motor/sensory deficits  LABORATORY DATA:  I have reviewed the data as listed    Component Value Date/Time   NA 138 03/30/2014 1029   NA 135 02/02/2014 0959   K 4.3 03/30/2014 1029   K 4.4 02/02/2014 0959   CL 102 02/02/2014 0959   CO2 26 03/30/2014 1029   CO2 26 02/02/2014 0959   GLUCOSE 104 03/30/2014 1029   GLUCOSE 105* 02/02/2014 0959   GLUCOSE 101* 05/28/2006 0830   BUN 12.7 03/30/2014 1029   BUN 10 02/02/2014 0959   CREATININE 0.8 03/30/2014 1029   CREATININE 0.9 02/02/2014 0959   CREATININE 0.95 05/13/2013 0953   CALCIUM 9.4 03/30/2014 1029   CALCIUM 9.0 02/02/2014 0959   PROT 6.3* 03/30/2014 1029   PROT 6.2 02/02/2014 0959   ALBUMIN 3.3* 03/30/2014 1029   ALBUMIN 3.7 02/02/2014 0959   AST 25 03/30/2014 1029   AST 26 02/02/2014 0959   ALT 10 03/30/2014 1029   ALT 12 02/02/2014 0959   ALKPHOS 75 03/30/2014 1029   ALKPHOS 77 02/02/2014 0959   BILITOT 0.88 03/30/2014 1029   BILITOT 1.2 02/02/2014 0959   GFRNONAA >60 01/22/2009 0535   GFRAA  Value: >60        The eGFR has been calculated using the MDRD equation. This calculation has not been validated in all clinical situations. eGFR's persistently <60 mL/min signify possible Chronic Kidney Disease. 01/22/2009 0535    No results found for this basename: SPEP, UPEP,  kappa and lambda light chains    Lab Results  Component Value Date   WBC 5.9 03/30/2014   NEUTROABS 3.8 03/30/2014   HGB 11.7* 03/30/2014   HCT 36.4* 03/30/2014   MCV 87.5 03/30/2014   PLT 168 03/30/2014      Chemistry      Component Value Date/Time   NA  138 03/30/2014 1029   NA 135 02/02/2014 0959   K 4.3 03/30/2014 1029   K 4.4 02/02/2014 0959   CL 102 02/02/2014 0959   CO2 26 03/30/2014 1029   CO2 26 02/02/2014 0959   BUN 12.7 03/30/2014 1029   BUN 10 02/02/2014 0959   CREATININE 0.8 03/30/2014 1029   CREATININE 0.9 02/02/2014 0959   CREATININE 0.95 05/13/2013 0953      Component Value Date/Time   CALCIUM 9.4 03/30/2014 1029   CALCIUM 9.0 02/02/2014 0959   ALKPHOS 75 03/30/2014 1029   ALKPHOS 77 02/02/2014 0959   AST 25 03/30/2014 1029   AST 26 02/02/2014 0959   ALT 10 03/30/2014 1029   ALT 12 02/02/2014 0959   BILITOT 0.88 03/30/2014 1029   BILITOT 1.2 02/02/2014 0959     I reviewed his outside records extensively. ASSESSMENT & PLAN:  Metastasis  to lymph nodes The patient has obtain a second opinion at [facility] and was recommended to hold his chemotherapy, pending plan for radiation treatment or surgery. I discussed with the patient the difficulties managing his case do to differences in opinion. He is waiting to hear back from the medical oncologist, Dr. Pia Mau with saw the patient, who is currently away for holiday. In the meantime, I would discontinue all his chemotherapy plan pending further review. In regards to his neck pain, it is not bothering him and he is not on any pain medication right now.  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 Currently, treatment is placed on hold pending further recommendation from [facility].   No orders of the defined types were placed in this encounter.   All questions were answered. The patient knows to call the clinic with any problems, questions or concerns. No barriers to learning was detected. I spent 30 minutes counseling the patient face to face. The total time spent in the appointment was 40 minutes and more than 50% was on counseling and review of test results     [facility], [place], MD 04/13/2014 2:45 PM

## 2014-04-13 NOTE — Assessment & Plan Note (Signed)
He is not symptomatic. Currently, treatment is placed on hold pending further recommendation from [facility].

## 2014-04-13 NOTE — Assessment & Plan Note (Addendum)
The patient has obtain a second opinion at [facility] and was recommended to hold his chemotherapy, pending plan for radiation treatment or surgery. I discussed with the patient the difficulties managing his case do to differences in opinion. He is waiting to hear back from the medical oncologist, Dr. Pia Mau with saw the patient, who is currently away for holiday. In the meantime, I would discontinue all his chemotherapy plan pending further review. In regards to his neck pain, it is not bothering him and he is not on any pain medication right now.

## 2014-04-21 IMAGING — CR DG CHEST 2V
2 series · 2 of 2 positions shown · non-contrast
Comparison: 01/15/2009

CLINICAL DATA: Cough, fluid retention

CHEST - 2 VIEW

[w chest pa]
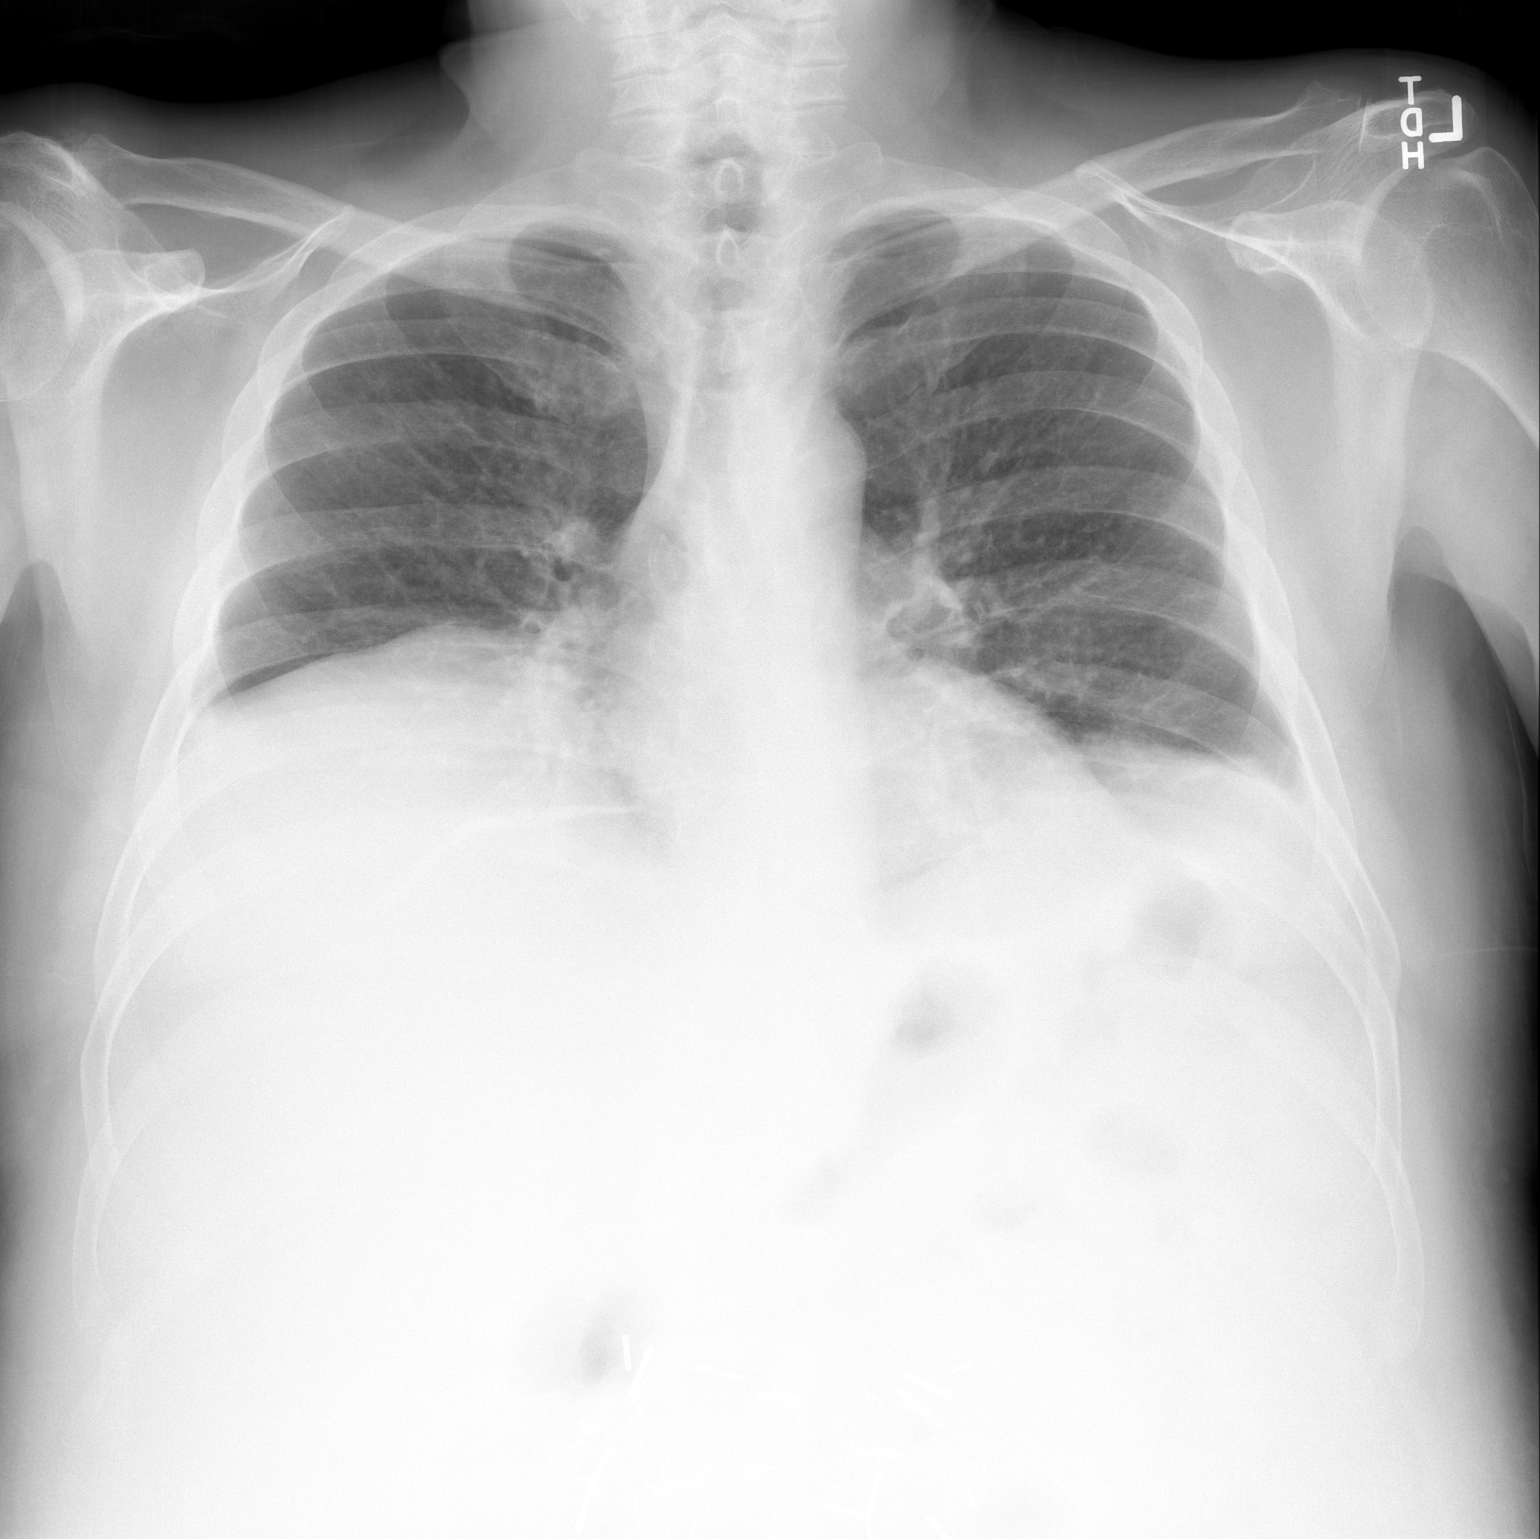

[w chest lat]
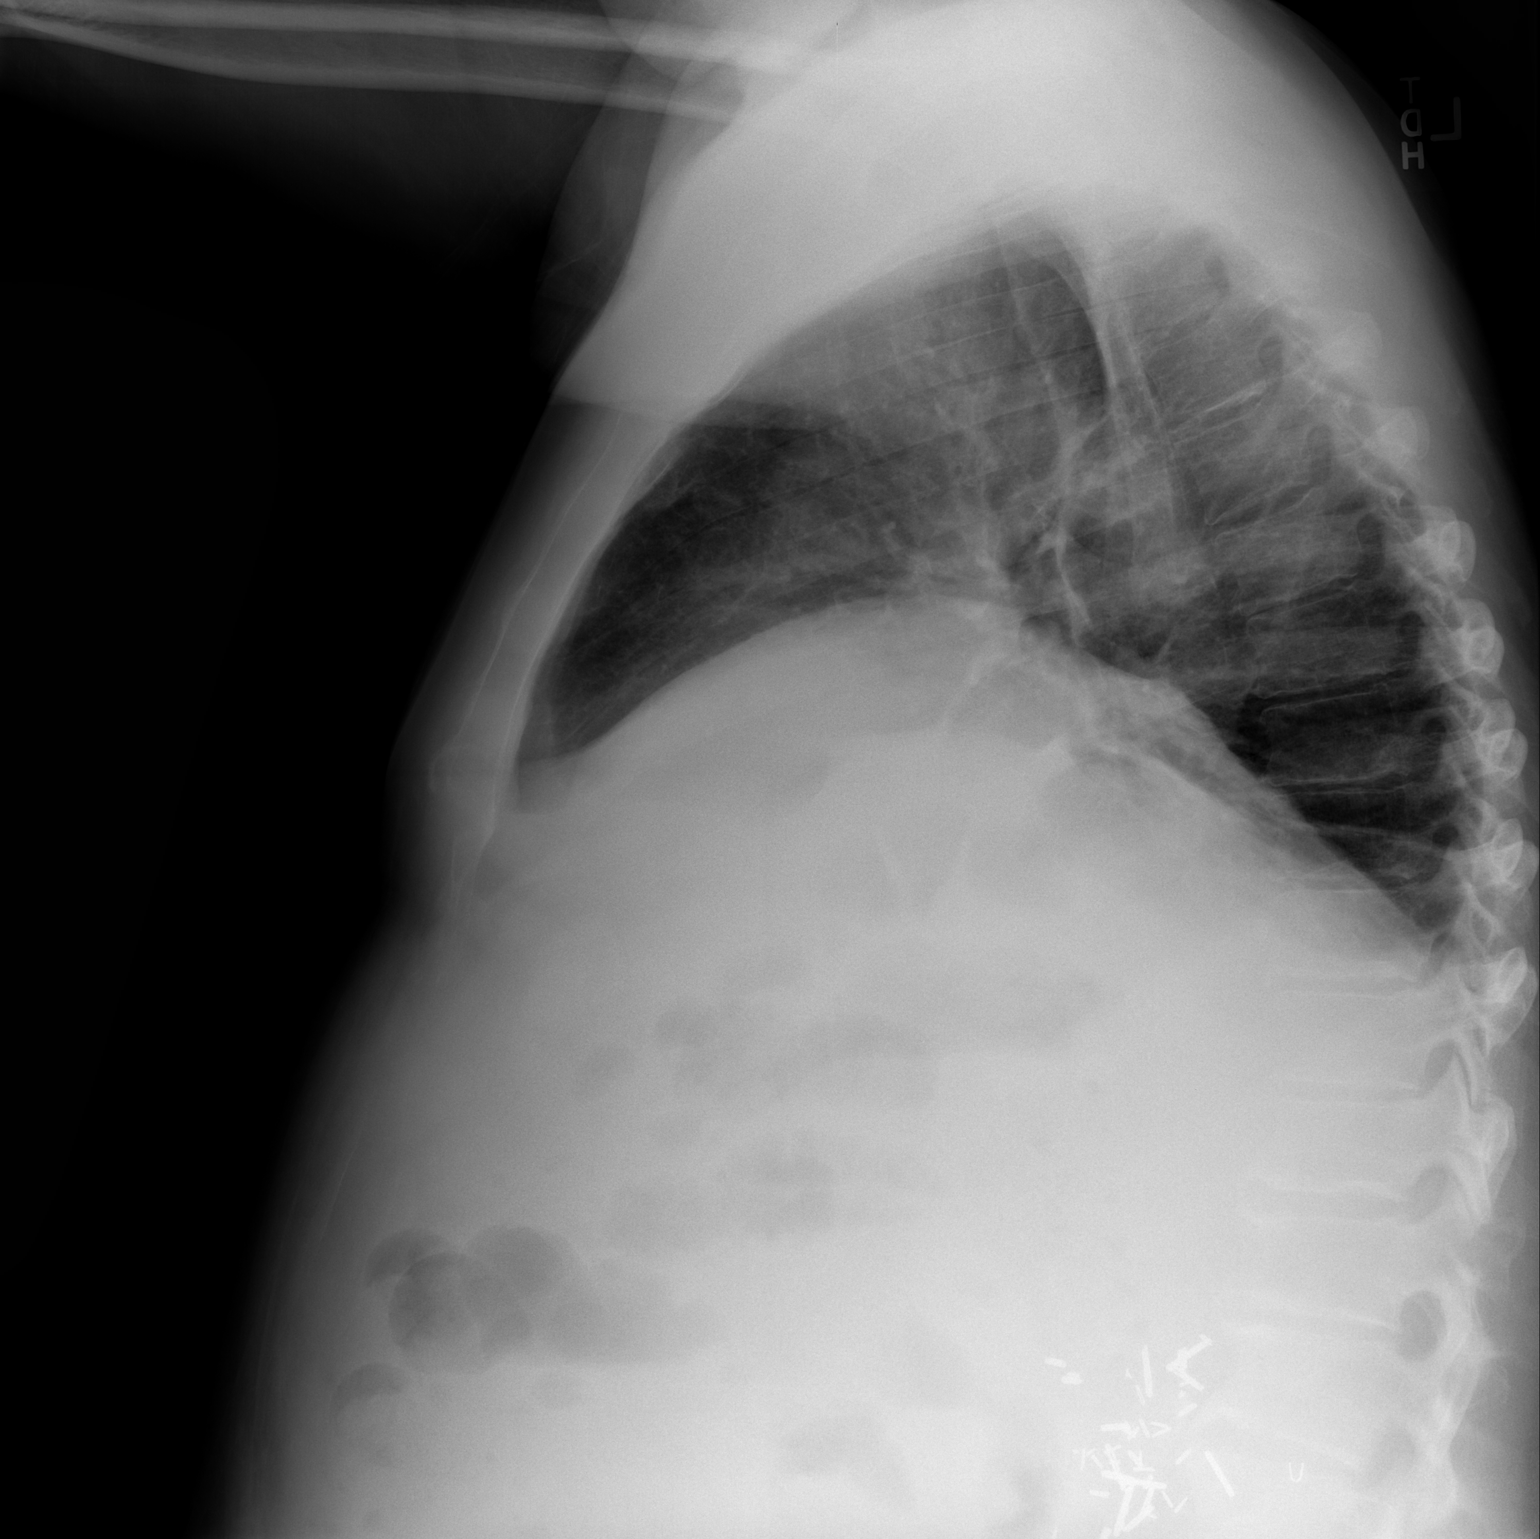

[2 of 2 positions shown; findings below may reference images not displayed]

FINDINGS: Low lung volumes with bibasilar atelectasis.  No frank
interstitial edema. No pleural effusion or pneumothorax.

Cardiomediastinal silhouette is within normal limits.

Mild degenerative changes of the visualized thoracolumbar spine.

Surgical clips overlying the midabdomen.
IMPRESSION: Low lung volumes with bibasilar atelectasis.

## 2014-04-24 ENCOUNTER — Telehealth: Payer: Self-pay | Admitting: *Deleted

## 2014-04-24 NOTE — Telephone Encounter (Signed)
Patient called to inform that he has decided to have his tmts at [facility].  He stated they have proposed a concurrent chemo/RT protocol.  He expressed appreciation for the care he has received at [facility].  I told him I wd inform Drs. Alvy Bimler and Isidore Moos.  Gayleen Orem, RN, BSN, [place] at [place] [phone]

## 2014-04-27 ENCOUNTER — Ambulatory Visit: Payer: BC Managed Care – PPO

## 2014-04-27 ENCOUNTER — Encounter: Payer: BC Managed Care – PPO | Admitting: Nutrition

## 2014-04-27 ENCOUNTER — Other Ambulatory Visit: Payer: BC Managed Care – PPO

## 2014-04-27 ENCOUNTER — Ambulatory Visit: Payer: BC Managed Care – PPO | Admitting: Hematology and Oncology

## 2014-05-06 ENCOUNTER — Other Ambulatory Visit: Payer: Self-pay | Admitting: Hematology and Oncology

## 2014-05-22 ENCOUNTER — Ambulatory Visit: Payer: BC Managed Care – PPO | Admitting: Family Medicine

## 2014-05-27 ENCOUNTER — Telehealth: Payer: Self-pay | Admitting: Family Medicine

## 2014-05-27 DIAGNOSIS — M542 Cervicalgia: Secondary | ICD-10-CM

## 2014-05-27 NOTE — Telephone Encounter (Signed)
SW patient's wife, patient was laying down resting, had chemo treatment today. He was in a lot of pain after today's treatment but hasn't been in too much pain with prior treatments. She will have him call back since he knows the details better.

## 2014-06-12 NOTE — Telephone Encounter (Signed)
Patient called back. He is very hoarse but sounded like he was in good spirits, said he "was beating his cancer" and he bought a new RV and is going to drive it [place] the [place]. This coming Tuesday he will be receiving his 7th week of chemo & radiation. Please call him back this afternoon if you can.

## 2014-06-12 NOTE — Telephone Encounter (Signed)
FYI Dr. Anitra Lauth, is there anything I should ask pt when I call?

## 2014-06-18 ENCOUNTER — Other Ambulatory Visit: Payer: Self-pay | Admitting: Family Medicine

## 2014-06-18 MED ORDER — SILDENAFIL CITRATE 50 MG PO TABS: 50.0000 mg | ORAL_TABLET | Freq: Every day | ORAL | Status: AC | PRN

## 2014-07-09 ENCOUNTER — Telehealth: Payer: Self-pay | Admitting: Family Medicine

## 2014-07-09 NOTE — Telephone Encounter (Signed)
LM on home & cell VM for patient to CB to schedule an appt

## 2014-07-09 NOTE — Telephone Encounter (Signed)
[place] [facility] - Client [facility]  Patient Name: Jared Garrett  DOB: 07/22/1950    Nurse Assessment      Guidelines    Guideline Title Affirmed Question Affirmed Notes  Leg Swelling and Edema [1] MILD swelling of both ankles (i.e., pedal edema) AND [2] new onset or worsening    Final Disposition User   See PCP When Office is Open (within 3 days) Loletta Specter, Therapist, sports, [place]

## 2014-07-16 ENCOUNTER — Ambulatory Visit: Payer: Self-pay | Admitting: Family Medicine

## 2014-08-18 ENCOUNTER — Other Ambulatory Visit: Payer: Self-pay | Admitting: *Deleted

## 2014-08-18 DIAGNOSIS — I6523 Occlusion and stenosis of bilateral carotid arteries: Secondary | ICD-10-CM

## 2014-08-27 ENCOUNTER — Ambulatory Visit: Payer: BC Managed Care – PPO | Admitting: Vascular Surgery

## 2014-08-27 ENCOUNTER — Other Ambulatory Visit (HOSPITAL_COMMUNITY): Payer: BC Managed Care – PPO

## 2014-09-11 ENCOUNTER — Telehealth: Payer: Self-pay | Admitting: Internal Medicine

## 2014-09-11 NOTE — Telephone Encounter (Signed)
Pt called and would like Dr. Vena Rua opinion, state they trust Dr. Hilarie Fredrickson. Pt has completed 7 weeks of daily radiation and chemo, states he feels great and never got sick. States not tired and blood work looks good. It is to soon for him to have more radiation. He is seeing Dr. Gertha Calkin at [address]. 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 Hilarie Fredrickson thinks the high dose chemo would have an adverse effect on his liver-due to problems with cirrhosis 3 years ago. Would also like to know if Dr. Hilarie Fredrickson thinks there would be chance of damage to other organs. Just want Dr. Vena Rua thoughts. Please advise.

## 2014-09-11 NOTE — Telephone Encounter (Signed)
Spoke with pt and pts wife and they are aware. Pt plans to go with the high dose chemo admission and this is planned for Wednesday.

## 2014-09-11 NOTE — Telephone Encounter (Signed)
Difficult for me to say because I do not know which chemotherapy drugs they are considering Nearly all chemotherapy has a chance of causing liver inflammation, but I am sure they are watching this closely In my past discussions with him, he has wanted appropriate but aggressive care targeting the most success possible Would obviously defer to his oncologist but with what I know of him, I would think option 1 would be what he would want Being in the hospital does allow for close monitoring should complications or problems arise I wish him only the best as he goes through this difficult treatment

## 2014-12-18 ENCOUNTER — Telehealth: Payer: Self-pay | Admitting: Family Medicine

## 2014-12-18 NOTE — Telephone Encounter (Signed)
Patient is being discharged from [facility] today or tomorrow. Patient needs an attending physician for Hospice. Hospice docs will assist with symptom management if you want. Please advise.

## 2014-12-28 NOTE — Telephone Encounter (Signed)
I will be his attending but I definitely prefer hospice docs be as involved with him as possible (symptom mgmt).-thx

## 2014-12-28 NOTE — Telephone Encounter (Signed)
Spoke with someone from Hospice she stated that pt passed away on 12-28-14.

## 2014-12-29 NOTE — Telephone Encounter (Signed)
Will you please start a sympathy card for his family? -thx

## 2014-12-31 ENCOUNTER — Telehealth: Payer: Self-pay | Admitting: Internal Medicine

## 2014-12-31 NOTE — Telephone Encounter (Signed)
Dr. Hilarie Fredrickson,  Jared Garrett, patients wife, called to let you know that Jared Garrett passed away on 2023/01/08.  Jared Garrett wanted you to know that she appreciated everything that you have done for them.  I offered my condolences and let her know that if she needed anything, do not hesitate to contact us.

## 2015-01-01 DEATH — deceased

## 2016-01-03 ENCOUNTER — Other Ambulatory Visit: Payer: Self-pay | Admitting: Internal Medicine
